# Patient Record
Sex: Female | Born: 1985 | Race: White | Hispanic: No | Marital: Married | Smoking: Never smoker
Health system: Southern US, Community
[De-identification: ages and names within clinical notes are randomized; demographics above are authoritative.]

## PROBLEM LIST (undated history)

## (undated) DIAGNOSIS — R87629 Unspecified abnormal cytological findings in specimens from vagina: Secondary | ICD-10-CM

## (undated) DIAGNOSIS — Z789 Other specified health status: Secondary | ICD-10-CM

## (undated) DIAGNOSIS — Z9889 Other specified postprocedural states: Secondary | ICD-10-CM

## (undated) HISTORY — DX: Unspecified abnormal cytological findings in specimens from vagina: R87.629

## (undated) HISTORY — PX: KNEE SURGERY: SHX244

## (undated) HISTORY — PX: GYNECOLOGIC CRYOSURGERY: SHX857

## (undated) HISTORY — PX: COLPOSCOPY: SHX161

## (undated) HISTORY — PX: WISDOM TOOTH EXTRACTION: SHX21

---
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
## 2003-10-11 ENCOUNTER — Other Ambulatory Visit: Admission: RE | Admit: 2003-10-11 | Discharge: 2003-10-11 | Payer: Self-pay | Admitting: Obstetrics & Gynecology

## 2004-10-18 ENCOUNTER — Other Ambulatory Visit: Admission: RE | Admit: 2004-10-18 | Discharge: 2004-10-18 | Payer: Self-pay | Admitting: Obstetrics & Gynecology

## 2013-04-14 NOTE — L&D Delivery Note (Signed)
Patient was C/C/+2 and pushed for 20 minutes with epidural.   NSVD female infant, Apgars 8,9, weight P.   The patient had one second degree episiotomy extended to partial (capsule only) third degree, repaired with 2-0 vicryl R. Fundus was firm. EBL was expected. Placenta was delivered intact. Vagina was clear.  Baby was vigorous and doing skin to skin with mother.  Alexandra Blair

## 2013-06-28 ENCOUNTER — Inpatient Hospital Stay (HOSPITAL_COMMUNITY)
Admission: EM | Admit: 2013-06-28 | Discharge: 2013-06-29 | Disposition: A | Discharge status: Home / Self Care | Payer: BC Managed Care – PPO | Attending: Obstetrics and Gynecology | Admitting: Obstetrics and Gynecology

## 2013-07-08 LAB — OB RESULTS CONSOLE RPR: RPR: NONREACTIVE

## 2013-07-08 LAB — OB RESULTS CONSOLE GC/CHLAMYDIA
Chlamydia: NEGATIVE
GC PROBE AMP, GENITAL: NEGATIVE

## 2013-07-08 LAB — OB RESULTS CONSOLE ANTIBODY SCREEN: Antibody Screen: NEGATIVE

## 2013-07-08 LAB — OB RESULTS CONSOLE RUBELLA ANTIBODY, IGM: Rubella: IMMUNE

## 2013-07-08 LAB — OB RESULTS CONSOLE HEPATITIS B SURFACE ANTIGEN: Hepatitis B Surface Ag: NEGATIVE

## 2013-07-08 LAB — OB RESULTS CONSOLE ABO/RH: RH Type: POSITIVE

## 2013-07-08 LAB — OB RESULTS CONSOLE HIV ANTIBODY (ROUTINE TESTING): HIV: NONREACTIVE

## 2013-12-30 LAB — OB RESULTS CONSOLE GBS: GBS: NEGATIVE

## 2014-02-02 ENCOUNTER — Inpatient Hospital Stay (HOSPITAL_COMMUNITY)
Admission: AD | Admit: 2014-02-02 | Discharge: 2014-02-02 | Disposition: A | Discharge status: Home / Self Care | Payer: BC Managed Care – PPO | Source: Ambulatory Visit | Attending: Obstetrics | Admitting: Obstetrics

## 2014-02-02 ENCOUNTER — Encounter (HOSPITAL_COMMUNITY): Payer: Self-pay | Admitting: *Deleted

## 2014-02-02 DIAGNOSIS — O480 Post-term pregnancy: Secondary | ICD-10-CM | POA: Insufficient documentation

## 2014-02-02 DIAGNOSIS — Z3A41 41 weeks gestation of pregnancy: Secondary | ICD-10-CM | POA: Diagnosis not present

## 2014-02-02 NOTE — MAU Note (Signed)
HC- [name]  TALKED WITH PT-   PT AND HUSBAND CHOSE TO GO BACK HOME-     HC- HAS PHONE #  AND L/D CHARGE WILL CALL PT.

## 2014-02-02 NOTE — MAU Note (Signed)
PT  SAYS SHE  WENT  TO DR ON Monday-  AND   SCH PT FOR TONIGHT  FOR AN INDUCTION-   FOR POSTDATES.     FEELS  SOME UC.  VE  ON Monday-   CLOSED.     HAD NST-   ALL GOOD.     DENIES HSV AND   MRSA.  GBS-  NEG.

## 2014-02-03 ENCOUNTER — Encounter (HOSPITAL_COMMUNITY): Payer: BC Managed Care – PPO | Admitting: Anesthesiology

## 2014-02-03 ENCOUNTER — Inpatient Hospital Stay (HOSPITAL_COMMUNITY): Payer: BC Managed Care – PPO | Admitting: Anesthesiology

## 2014-02-03 ENCOUNTER — Inpatient Hospital Stay (HOSPITAL_COMMUNITY)
Admission: AD | Admit: 2014-02-03 | Discharge: 2014-02-05 | DRG: 775 | Disposition: A | Discharge status: Home / Self Care | Payer: BC Managed Care – PPO | Source: Ambulatory Visit | Attending: Obstetrics and Gynecology | Admitting: Obstetrics and Gynecology

## 2014-02-03 ENCOUNTER — Encounter (HOSPITAL_COMMUNITY): Payer: Self-pay | Admitting: Obstetrics

## 2014-02-03 DIAGNOSIS — Z3A41 41 weeks gestation of pregnancy: Secondary | ICD-10-CM | POA: Diagnosis present

## 2014-02-03 DIAGNOSIS — O480 Post-term pregnancy: Secondary | ICD-10-CM | POA: Diagnosis present

## 2014-02-03 HISTORY — DX: Other specified postprocedural states: Z98.890

## 2014-02-03 HISTORY — DX: Other specified health status: Z78.9

## 2014-02-03 LAB — HIV ANTIBODY (ROUTINE TESTING W REFLEX): HIV: NONREACTIVE

## 2014-02-03 LAB — CBC
HEMATOCRIT: 35.2 % — AB (ref 36.0–46.0)
HEMOGLOBIN: 12.3 g/dL (ref 12.0–15.0)
MCH: 31.5 pg (ref 26.0–34.0)
MCHC: 34.9 g/dL (ref 30.0–36.0)
MCV: 90 fL (ref 78.0–100.0)
Platelets: 244 10*3/uL (ref 150–400)
RBC: 3.91 MIL/uL (ref 3.87–5.11)
RDW: 13.2 % (ref 11.5–15.5)
WBC: 11.3 10*3/uL — ABNORMAL HIGH (ref 4.0–10.5)

## 2014-02-03 LAB — TYPE AND SCREEN
ABO/RH(D): B POS
Antibody Screen: NEGATIVE

## 2014-02-03 LAB — ABO/RH: ABO/RH(D): B POS

## 2014-02-03 LAB — RPR

## 2014-02-03 MED ORDER — NALBUPHINE HCL 10 MG/ML IJ SOLN: 5.0000 mg | INTRAMUSCULAR | Status: DC | PRN

## 2014-02-03 MED ORDER — OXYTOCIN BOLUS FROM INFUSION
500.0000 mL | INTRAVENOUS | Status: DC
  Administered 2014-02-03: 500 mL via INTRAVENOUS

## 2014-02-03 MED ORDER — FENTANYL 2.5 MCG/ML BUPIVACAINE 1/10 % EPIDURAL INFUSION (WH - ANES)
INTRAMUSCULAR | Status: DC | PRN
  Administered 2014-02-03: 14 mL/h via EPIDURAL

## 2014-02-03 MED ORDER — EPHEDRINE 5 MG/ML INJ
10.0000 mg | INTRAVENOUS | Status: DC | PRN
  Filled 2014-02-03: qty 2

## 2014-02-03 MED ORDER — SODIUM CHLORIDE 0.9 % IJ SOLN: 3.0000 mL | INTRAMUSCULAR | Status: DC | PRN

## 2014-02-03 MED ORDER — FENTANYL 2.5 MCG/ML BUPIVACAINE 1/10 % EPIDURAL INFUSION (WH - ANES)
14.0000 mL/h | INTRAMUSCULAR | Status: DC | PRN
  Administered 2014-02-03 (×2): 14 mL/h via EPIDURAL
  Filled 2014-02-03 (×2): qty 125

## 2014-02-03 MED ORDER — MISOPROSTOL 25 MCG QUARTER TABLET
25.0000 ug | ORAL_TABLET | ORAL | Status: DC | PRN
  Administered 2014-02-03: 25 ug via VAGINAL
  Filled 2014-02-03: qty 0.25
  Filled 2014-02-03: qty 1

## 2014-02-03 MED ORDER — CITRIC ACID-SODIUM CITRATE 334-500 MG/5ML PO SOLN: 30.0000 mL | ORAL | Status: DC | PRN

## 2014-02-03 MED ORDER — LANOLIN HYDROUS EX OINT: TOPICAL_OINTMENT | CUTANEOUS | Status: DC | PRN

## 2014-02-03 MED ORDER — WITCH HAZEL-GLYCERIN EX PADS: 1.0000 "application " | MEDICATED_PAD | CUTANEOUS | Status: DC | PRN

## 2014-02-03 MED ORDER — ZOLPIDEM TARTRATE 5 MG PO TABS
5.0000 mg | ORAL_TABLET | Freq: Every evening | ORAL | Status: DC | PRN
Start: 2014-02-03 — End: 2014-02-05

## 2014-02-03 MED ORDER — SODIUM CHLORIDE 0.9 % IV SOLN: 250.0000 mL | INTRAVENOUS | Status: DC | PRN

## 2014-02-03 MED ORDER — LACTATED RINGERS IV SOLN
500.0000 mL | Freq: Once | INTRAVENOUS | Status: AC
  Administered 2014-02-03: 500 mL via INTRAVENOUS

## 2014-02-03 MED ORDER — SODIUM CHLORIDE 0.9 % IJ SOLN: 3.0000 mL | Freq: Two times a day (BID) | INTRAMUSCULAR | Status: DC

## 2014-02-03 MED ORDER — ONDANSETRON HCL 4 MG/2ML IJ SOLN: 4.0000 mg | INTRAMUSCULAR | Status: DC | PRN

## 2014-02-03 MED ORDER — SENNOSIDES-DOCUSATE SODIUM 8.6-50 MG PO TABS
2.0000 | ORAL_TABLET | ORAL | Status: DC
  Administered 2014-02-03 – 2014-02-05 (×2): 2 via ORAL
  Filled 2014-02-03 (×2): qty 2

## 2014-02-03 MED ORDER — METHYLERGONOVINE MALEATE 0.2 MG/ML IJ SOLN: 0.2000 mg | INTRAMUSCULAR | Status: DC | PRN

## 2014-02-03 MED ORDER — MEASLES, MUMPS & RUBELLA VAC ~~LOC~~ INJ: 0.5000 mL | INJECTION | Freq: Once | SUBCUTANEOUS | Status: DC

## 2014-02-03 MED ORDER — IBUPROFEN 800 MG PO TABS
800.0000 mg | ORAL_TABLET | Freq: Three times a day (TID) | ORAL | Status: DC
  Administered 2014-02-03 – 2014-02-05 (×4): 800 mg via ORAL
  Filled 2014-02-03 (×4): qty 1

## 2014-02-03 MED ORDER — PHENYLEPHRINE 40 MCG/ML (10ML) SYRINGE FOR IV PUSH (FOR BLOOD PRESSURE SUPPORT)
80.0000 ug | PREFILLED_SYRINGE | INTRAVENOUS | Status: DC | PRN
  Filled 2014-02-03: qty 2

## 2014-02-03 MED ORDER — TERBUTALINE SULFATE 1 MG/ML IJ SOLN: 0.2500 mg | Freq: Once | INTRAMUSCULAR | Status: DC | PRN

## 2014-02-03 MED ORDER — LIDOCAINE HCL (PF) 1 % IJ SOLN
INTRAMUSCULAR | Status: DC | PRN
  Administered 2014-02-03 (×2): 4 mL

## 2014-02-03 MED ORDER — LACTATED RINGERS IV SOLN: 500.0000 mL | INTRAVENOUS | Status: DC | PRN

## 2014-02-03 MED ORDER — PHENYLEPHRINE 40 MCG/ML (10ML) SYRINGE FOR IV PUSH (FOR BLOOD PRESSURE SUPPORT)
80.0000 ug | PREFILLED_SYRINGE | INTRAVENOUS | Status: DC | PRN
  Filled 2014-02-03: qty 2
  Filled 2014-02-03: qty 10

## 2014-02-03 MED ORDER — SIMETHICONE 80 MG PO CHEW: 80.0000 mg | CHEWABLE_TABLET | ORAL | Status: DC | PRN

## 2014-02-03 MED ORDER — OXYTOCIN 40 UNITS IN LACTATED RINGERS INFUSION - SIMPLE MED
62.5000 mL/h | INTRAVENOUS | Status: DC
Start: 2014-02-03 — End: 2014-02-03
  Filled 2014-02-03: qty 1000

## 2014-02-03 MED ORDER — OXYCODONE-ACETAMINOPHEN 5-325 MG PO TABS: 2.0000 | ORAL_TABLET | ORAL | Status: DC | PRN

## 2014-02-03 MED ORDER — MAGNESIUM HYDROXIDE 400 MG/5ML PO SUSP: 30.0000 mL | ORAL | Status: DC | PRN

## 2014-02-03 MED ORDER — DIPHENHYDRAMINE HCL 25 MG PO CAPS: 25.0000 mg | ORAL_CAPSULE | Freq: Four times a day (QID) | ORAL | Status: DC | PRN

## 2014-02-03 MED ORDER — PRENATAL MULTIVITAMIN CH: 1.0000 | ORAL_TABLET | Freq: Every day | ORAL | Status: DC

## 2014-02-03 MED ORDER — ONDANSETRON HCL 4 MG/2ML IJ SOLN: 4.0000 mg | Freq: Four times a day (QID) | INTRAMUSCULAR | Status: DC | PRN

## 2014-02-03 MED ORDER — OXYCODONE-ACETAMINOPHEN 5-325 MG PO TABS
2.0000 | ORAL_TABLET | ORAL | Status: DC | PRN
Start: 2014-02-03 — End: 2014-02-05

## 2014-02-03 MED ORDER — LACTATED RINGERS IV SOLN
INTRAVENOUS | Status: DC
  Administered 2014-02-03 (×2): via INTRAVENOUS

## 2014-02-03 MED ORDER — METHYLERGONOVINE MALEATE 0.2 MG PO TABS: 0.2000 mg | ORAL_TABLET | ORAL | Status: DC | PRN

## 2014-02-03 MED ORDER — LIDOCAINE HCL (PF) 1 % IJ SOLN
30.0000 mL | INTRAMUSCULAR | Status: DC | PRN
  Filled 2014-02-03: qty 30

## 2014-02-03 MED ORDER — ACETAMINOPHEN 325 MG PO TABS: 650.0000 mg | ORAL_TABLET | ORAL | Status: DC | PRN

## 2014-02-03 MED ORDER — BENZOCAINE-MENTHOL 20-0.5 % EX AERO
1.0000 "application " | INHALATION_SPRAY | CUTANEOUS | Status: DC | PRN
  Administered 2014-02-03 – 2014-02-05 (×2): 1 via TOPICAL
  Filled 2014-02-03 (×2): qty 56

## 2014-02-03 MED ORDER — DIPHENHYDRAMINE HCL 50 MG/ML IJ SOLN: 12.5000 mg | INTRAMUSCULAR | Status: DC | PRN

## 2014-02-03 MED ORDER — DIBUCAINE 1 % RE OINT: 1.0000 "application " | TOPICAL_OINTMENT | RECTAL | Status: DC | PRN

## 2014-02-03 MED ORDER — ONDANSETRON HCL 4 MG PO TABS: 4.0000 mg | ORAL_TABLET | ORAL | Status: DC | PRN

## 2014-02-03 MED ORDER — OXYTOCIN 40 UNITS IN LACTATED RINGERS INFUSION - SIMPLE MED
1.0000 m[IU]/min | INTRAVENOUS | Status: DC
Start: 2014-02-03 — End: 2014-02-03
  Administered 2014-02-03: 2 m[IU]/min via INTRAVENOUS

## 2014-02-03 MED ORDER — OXYCODONE-ACETAMINOPHEN 5-325 MG PO TABS: 1.0000 | ORAL_TABLET | ORAL | Status: DC | PRN

## 2014-02-03 MED ORDER — FERROUS SULFATE 325 (65 FE) MG PO TABS
325.0000 mg | ORAL_TABLET | Freq: Two times a day (BID) | ORAL | Status: DC
  Administered 2014-02-04 – 2014-02-05 (×3): 325 mg via ORAL
  Filled 2014-02-03 (×3): qty 1

## 2014-02-03 MED ORDER — OXYCODONE-ACETAMINOPHEN 5-325 MG PO TABS
1.0000 | ORAL_TABLET | ORAL | Status: DC | PRN
Start: 2014-02-03 — End: 2014-02-05

## 2014-02-03 MED ORDER — TETANUS-DIPHTH-ACELL PERTUSSIS 5-2.5-18.5 LF-MCG/0.5 IM SUSP
0.5000 mL | Freq: Once | INTRAMUSCULAR | Status: AC
  Administered 2014-02-05: 0.5 mL via INTRAMUSCULAR

## 2014-02-03 NOTE — H&P (Signed)
28 y.o. G1P0 @ 7859w1d presents for IOL for postdates.  Otherwise has good fetal movement and no bleeding.  Past Medical History  Diagnosis Date  . H/O knee surgery   . Medical history non-contributory    History reviewed. No pertinent past surgical history.  OB History  Gravida Para Term Preterm AB SAB TAB Ectopic Multiple Living  1             # Outcome Date GA Lbr Len/2nd Weight Sex Delivery Anes PTL Lv  1 CUR               History   Social History  . Marital Status: Married    Spouse Name: N/A    Number of Children: N/A  . Years of Education: N/A   Occupational History  . Not on file.   Social History Main Topics  . Smoking status: Never Smoker   . Smokeless tobacco: Never Used  . Alcohol Use: No  . Drug Use: No  . Sexual Activity: Not on file   Other Topics Concern  . Not on file   Social History Narrative  . No narrative on file   Review of patient's allergies indicates no known allergies.    Prenatal Transfer Tool  Maternal Diabetes: No Genetic Screening: Normal Maternal Ultrasounds/Referrals: Normal Fetal Ultrasounds or other Referrals:  None Maternal Substance Abuse:  No Significant Maternal Medications:  None Significant Maternal Lab Results: Lab values include: Group B Strep negative  Other PNC: uncomplicated.    Filed Vitals:   02/03/14 0149  BP: 131/84  Pulse: 102  Temp: 98.3 F (36.8 C)  Resp: 18     General:  NAD Lungs: CTAB Cardiac: RRR Abdomen:  soft, gravid, EFW 8.5# Ex:  no edema SVE:  Closed/50  FHTs:  140s mod var, cat 1  Toco:  quiet  Ultrasound 10/12: EFW 3678g (8lb 2oz) 83%, vtx  A/P   28 y.o. 7859w1d  G1 presents for IOL for postdates Cytotec for cervical ripening Epidural upon maternal request GBS neg  Marlow BaarsLARK, Reyansh Kushnir

## 2014-02-03 NOTE — Anesthesia Procedure Notes (Signed)
Epidural Patient location during procedure: OB Start time: 02/03/2014 11:23 AM  Staffing Anesthesiologist: Dyland Panuco A. Performed by: anesthesiologist   Preanesthetic Checklist Completed: patient identified, site marked, surgical consent, pre-op evaluation, timeout performed, IV checked, risks and benefits discussed and monitors and equipment checked  Epidural Patient position: sitting Prep: site prepped and draped and DuraPrep Patient monitoring: continuous pulse ox and blood pressure Approach: midline Location: L4-L5 Injection technique: LOR air  Needle:  Needle type: Tuohy  Needle gauge: 17 G Needle length: 9 cm and 9 Needle insertion depth: 4 cm Catheter type: closed end flexible Catheter size: 19 Gauge Catheter at skin depth: 10 cm Test dose: negative and Other  Assessment Events: blood not aspirated, injection not painful, no injection resistance, negative IV test and no paresthesia  Additional Notes Patient identified. Risks and benefits discussed including failed block, incomplete  Pain control, post dural puncture headache, nerve damage, paralysis, blood pressure Changes, nausea, vomiting, reactions to medications-both toxic and allergic and post Partum back pain. All questions were answered. Patient expressed understanding and wished to proceed. Sterile technique was used throughout procedure. Epidural site was Dressed with sterile barrier dressing. No paresthesias, signs of intravascular injection Or signs of intrathecal spread were encountered.  Patient was more comfortable after the epidural was dosed. Please see RN's note for documentation of vital signs and FHR which are stable.

## 2014-02-03 NOTE — Anesthesia Preprocedure Evaluation (Signed)
Anesthesia Evaluation  Patient identified by MRN, date of birth, ID band Patient awake    Reviewed: Allergy & Precautions, H&P , Patient's Chart, lab work & pertinent test results  Airway Mallampati: III TM Distance: >3 FB Neck ROM: Full    Dental no notable dental hx. (+) Teeth Intact   Pulmonary neg pulmonary ROS,  breath sounds clear to auscultation  Pulmonary exam normal       Cardiovascular negative cardio ROS  Rhythm:Regular Rate:Normal     Neuro/Psych negative neurological ROS  negative psych ROS   GI/Hepatic negative GI ROS, Neg liver ROS,   Endo/Other  Obesity  Renal/GU negative Renal ROS  negative genitourinary   Musculoskeletal negative musculoskeletal ROS (+)   Abdominal (+) + obese,   Peds  Hematology negative hematology ROS (+)   Anesthesia Other Findings   Reproductive/Obstetrics (+) Pregnancy                           Anesthesia Physical Anesthesia Plan  ASA: II  Anesthesia Plan: Epidural   Post-op Pain Management:    Induction:   Airway Management Planned: Natural Airway  Additional Equipment:   Intra-op Plan:   Post-operative Plan:   Informed Consent: I have reviewed the patients History and Physical, chart, labs and discussed the procedure including the risks, benefits and alternatives for the proposed anesthesia with the patient or authorized representative who has indicated his/her understanding and acceptance.     Plan Discussed with: Anesthesiologist  Anesthesia Plan Comments:         Anesthesia Quick Evaluation

## 2014-02-04 LAB — CBC
HCT: 33.6 % — ABNORMAL LOW (ref 36.0–46.0)
HEMOGLOBIN: 11.5 g/dL — AB (ref 12.0–15.0)
MCH: 30.8 pg (ref 26.0–34.0)
MCHC: 34.2 g/dL (ref 30.0–36.0)
MCV: 90.1 fL (ref 78.0–100.0)
Platelets: 206 10*3/uL (ref 150–400)
RBC: 3.73 MIL/uL — AB (ref 3.87–5.11)
RDW: 13.2 % (ref 11.5–15.5)
WBC: 17.2 10*3/uL — ABNORMAL HIGH (ref 4.0–10.5)

## 2014-02-04 NOTE — Progress Notes (Signed)
Patient is eating, ambulating, voiding.  Pain control is good.  Filed Vitals:   02/03/14 2101 02/03/14 2130 02/03/14 2300 02/04/14 0306  BP:  125/86 133/81 127/86  Pulse: 92 95 93 75  Temp:  98.5 F (36.9 C) 99.3 F (37.4 C) 98.2 F (36.8 C)  TempSrc:   Oral Oral  Resp: 20 20 18 18   Height:      Weight:      SpO2:        Fundus firm Perineum without swelling.  Lab Results  Component Value Date   WBC 17.2* 02/04/2014   HGB 11.5* 02/04/2014   HCT 33.6* 02/04/2014   MCV 90.1 02/04/2014   PLT 206 02/04/2014    --/--/B POS (10/23 0610)/RI  A/P Post partum day 1.  Routine care.  Expect d/c routine.    Marcelyn Ruppe A

## 2014-02-04 NOTE — Lactation Note (Signed)
This note was copied from the chart of Alexandra Ashia Dobis. Lactation Consultation Note  Initial visit done.  Breastfeeding consultation services and support information given to patient.  Mom states baby is been latching and nursing well.  Encouraged to feed with cues and to call with concerns/assist prn  Patient Name: Alexandra Cathrine MusterHeather Blair Alexandra Blair's Date: 02/04/2014 Reason for consult: Initial assessment   Maternal Data    Feeding    LATCH Score/Interventions Latch: Repeated attempts needed to sustain latch, nipple held in mouth throughout feeding, stimulation needed to elicit sucking reflex. Intervention(s): Assist with latch;Breast massage;Breast compression  Audible Swallowing: A few with stimulation Intervention(s): Hand expression  Type of Nipple: Everted at rest and after stimulation  Comfort (Breast/Nipple): Soft / non-tender  Problem noted: Mild/Moderate discomfort Interventions (Mild/moderate discomfort): Hand expression  Hold (Positioning): No assistance needed to correctly position infant at breast.  LATCH Score: 8  Lactation Tools Discussed/Used     Consult Status Consult Status: Follow-up Date: 02/05/14 Follow-up type: In-patient    [name], Marqueta Pulley S 02/04/2014, 3:40 PM

## 2014-02-04 NOTE — Anesthesia Postprocedure Evaluation (Signed)
Anesthesia Post Note  Patient: Alexandra BasemanHeather M Blair  Procedure(s) Performed: * No procedures listed *  Anesthesia type: Epidural  Patient location: Mother/Baby  Post pain: Pain level controlled  Post assessment: Post-op Vital signs reviewed  Last Vitals:  Filed Vitals:   02/04/14 0306  BP: 127/86  Pulse: 75  Temp: 36.8 C  Resp: 18    Post vital signs: Reviewed  Level of consciousness:alert  Complications: No apparent anesthesia complications

## 2014-02-05 NOTE — Lactation Note (Signed)
This note was copied from the chart of Alexandra Blair. Lactation Consultation Note  Follow up visit made prior to discharge.  Mom states baby cluster fed all night.  Nipples a little tender.  Mom given comfort gels with instructions.  Basic teaching reviewed along with discharge teaching including engorgement treatment.  Encouraged lactation outpatient services including support group.  Patient Name: Alexandra Blair: 02/05/2014     Maternal Data    Feeding Length of feed: 12 min  LATCH Score/Interventions                      Lactation Tools Discussed/Used     Consult Status      [name], Marjarie Irion S 02/05/2014, 11:03 AM

## 2014-02-05 NOTE — Discharge Summary (Signed)
Obstetric Discharge Summary Reason for Admission: induction of labor Prenatal Procedures: NST and ultrasound Intrapartum Procedures: spontaneous vaginal delivery and episiotomy partial 3rd Postpartum Procedures: none Complications-Operative and Postpartum: none Hemoglobin  Date Value Ref Range Status  02/04/2014 11.5* 12.0 - 15.0 g/dL Final     HCT  Date Value Ref Range Status  02/04/2014 33.6* 36.0 - 46.0 % Final    Physical Exam:  General: alert Lochia: appropriate Uterine Fundus: firm  Discharge Diagnoses: Post-date pregnancy  Discharge Information: Date: 02/05/2014 Activity: pelvic rest Diet: routine Medications: PNV and Ibuprofen Condition: stable Instructions: refer to practice specific booklet Discharge to: home Follow-up Information   Follow up with HORVATH,MICHELLE A, MD. Schedule an appointment as soon as possible for a visit in 4 weeks.   Specialty:  Obstetrics and Gynecology   Contact information:   [address] [place] [address] [phone]       Newborn Data: Live born female  Birth Weight: 7 lb 11.4 oz (3498 g) APGAR: 8, 9  Home with mother.  ANDERSON,MARK E 02/05/2014, 10:10 AM

## 2014-02-05 NOTE — Lactation Note (Signed)
This note was copied from the chart of Alexandra Cambelle Lampley. Lactation Consultation Note  Baby cluster fed last night.  Some nipple tenderness.  Reviewed basics and discharge teaching including engorgement treatment.  Comfort gels given with instructions.  Encouraged outpatient lactation services and support group.  Patient Name: Alexandra Cathrine MusterHeather Leaton [id number]'s Date: 02/05/2014     Maternal Data    Feeding Length of feed: 12 min  LATCH Score/Interventions                      Lactation Tools Discussed/Used     Consult Status      [name], Ayn Domangue S 02/05/2014, 10:25 AM

## 2014-02-05 NOTE — Progress Notes (Signed)
PPD#2 Pt without complaints. Wants to go home. VSSAF IMP/ stable Plan/ Will discharge.

## 2014-02-13 ENCOUNTER — Encounter (HOSPITAL_COMMUNITY): Payer: Self-pay | Admitting: Obstetrics

## 2017-02-23 ENCOUNTER — Inpatient Hospital Stay (HOSPITAL_COMMUNITY)
Admission: AD | Admit: 2017-02-23 | Discharge: 2017-02-23 | Disposition: A | Discharge status: Home / Self Care | Payer: BLUE CROSS/BLUE SHIELD | Source: Ambulatory Visit | Attending: Obstetrics & Gynecology | Admitting: Obstetrics & Gynecology

## 2017-02-23 DIAGNOSIS — O200 Threatened abortion: Secondary | ICD-10-CM | POA: Insufficient documentation

## 2017-02-23 DIAGNOSIS — O4690 Antepartum hemorrhage, unspecified, unspecified trimester: Secondary | ICD-10-CM

## 2017-02-23 LAB — URINALYSIS, COMPLETE (UACMP) WITH MICROSCOPIC
Bilirubin Urine: NEGATIVE
GLUCOSE, UA: NEGATIVE mg/dL
KETONES UR: NEGATIVE mg/dL
Nitrite: NEGATIVE
Protein, ur: NEGATIVE mg/dL
Specific Gravity, Urine: 1.005 (ref 1.005–1.030)
pH: 5 (ref 5.0–8.0)

## 2017-02-23 LAB — HCG, QUANTITATIVE, PREGNANCY: HCG, BETA CHAIN, QUANT, S: 21 m[IU]/mL — AB (ref <5)

## 2017-02-23 LAB — POCT PREGNANCY, URINE: PREG TEST UR: NEGATIVE

## 2017-02-23 NOTE — MAU Provider Note (Signed)
History     CSN: [id number]  Arrival date and time: 02/23/17 1448   None     No chief complaint on file.  HPI   Alexandra BasemanHeather M Blair is a 31 y.o. female G1P1001 @ Unknown here in MAU with concerns about a miscarriage. States her LMP was 01/15/2017. States she has been spotting off and on for the last week. States she passed a small clot this morning that she is sure was the pregnancy. [facility]  States she had a positive test at home this month. She continues to have light spotting, no pain currently.   OB History    Gravida Para Term Preterm AB Living   1 1 1     1    SAB TAB Ectopic Multiple Live Births           1      Past Medical History:  Diagnosis Date  . H/O knee surgery   . Medical history non-contributory     Past Surgical History:  Procedure Laterality Date  . KNEE SURGERY N/A   . WISDOM TOOTH EXTRACTION N/A     No family history on file.  Social History   Tobacco Use  . Smoking status: Never Smoker  . Smokeless tobacco: Never Used  Substance Use Topics  . Alcohol use: No  . Drug use: No    Allergies:  Allergies  Allergen Reactions  . Codeine Other (See Comments)    dizzy    Medications Prior to Admission  Medication Sig Dispense Refill Last Dose  . Prenatal Vit-Fe Fumarate-FA (PRENATAL MULTIVITAMIN) TABS tablet Take 1 tablet by mouth daily at 12 noon.   02/02/2014 at Unknown time   Results for orders placed or performed during the hospital encounter of 02/23/17 (from the past 48 hour(s))  Urinalysis, Complete w Microscopic     Status: Abnormal   Collection Time: 02/23/17  1:30 PM  Result Value Ref Range   Color, Urine YELLOW YELLOW   APPearance CLOUDY (A) CLEAR   Specific Gravity, Urine 1.005 1.005 - 1.030   pH 5.0 5.0 - 8.0   Glucose, UA NEGATIVE NEGATIVE mg/dL   Hgb urine dipstick LARGE (A) NEGATIVE   Bilirubin Urine NEGATIVE NEGATIVE   Ketones, ur NEGATIVE NEGATIVE mg/dL    Protein, ur NEGATIVE NEGATIVE mg/dL   Nitrite NEGATIVE NEGATIVE   Leukocytes, UA TRACE (A) NEGATIVE   RBC / HPF 0-5 0 - 5 RBC/hpf   WBC, UA 6-30 0 - 5 WBC/hpf   Bacteria, UA FEW (A) NONE SEEN   Squamous Epithelial / LPF 6-30 (A) NONE SEEN   Mucus PRESENT   Pregnancy, urine POC     Status: None   Collection Time: 02/23/17  4:02 PM  Result Value Ref Range   Preg Test, Ur NEGATIVE NEGATIVE    Comment:        THE SENSITIVITY OF THIS METHODOLOGY IS >24 mIU/mL   hCG, quantitative, pregnancy     Status: Abnormal   Collection Time: 02/23/17  4:33 PM  Result Value Ref Range   hCG, Beta Chain, Quant, S 21 (H) <5 mIU/mL    Comment:          GEST. AGE      CONC.  (mIU/mL)   <=1 WEEK        5 - 50     2 WEEKS       50 - 500     3  WEEKS       100 - 10,000     4 WEEKS     1,000 - 30,000     5 WEEKS     3,500 - 115,000   6-8 WEEKS     12,000 - 270,000    12 WEEKS     15,000 - 220,000        FEMALE AND NON-PREGNANT FEMALE:     LESS THAN 5 mIU/mL     Review of Systems  Constitutional: Negative for fever.  Gastrointestinal: Negative for abdominal pain.  Genitourinary: Positive for vaginal pain. Negative for vaginal discharge.   Physical Exam   Blood pressure 131/78, pulse 93, temperature 98.8 F (37.1 C), temperature source Oral, resp. rate 17, weight 64 kg (141 lb), SpO2 100 %, unknown if currently breastfeeding.  Physical Exam  Constitutional: She is oriented to person, place, and time. She appears well-developed and well-nourished. No distress.  HENT:  Head: Normocephalic.  Eyes: Pupils are equal, round, and reactive to light.  Musculoskeletal: Normal range of motion.  Neurological: She is alert and oriented to person, place, and time.  Skin: Skin is warm. She is not diaphoretic.  Psychiatric: Her behavior is normal.   MAU Course  Procedures  None  MDM  Patient had been waiting for a long period of time in the waiting area. Urine pregnancy test negative. Quant ordered.  Patient is without pain today. Discussed with Dr. Mora ApplPinn. Ok for patient to leave while Hcg levels are pending. I will call the patient with the results. Patient is agreeable.   Assessment and Plan   A:  1. Threatened miscarriage   2. Vaginal bleeding in pregnancy     P:  Discharge home with strict return precautions Patient to follow up with Dr. Madalyn RobPinn's office in the next week. Return to MAU if symptoms worsen Bleeding precautions Quant 21 today,  Left message to have patient return my call to discuss.   Venia Carbonasch, Jennifer I, NP 02/23/2017 6:05 PM

## 2017-04-14 NOTE — L&D Delivery Note (Signed)
Delivery Note At 10:10 AM a viable and healthy female was delivered via  (Presentation: Right Occiput; Anterior ).  APGAR: 9, 9; weight  pending.   Placenta status: spontaneous, intact.  Cord: 3V  Anesthesia:  Epidural Episiotomy:  none Lacerations:  1st degree vaginal Suture Repair: 3.0 vicryl Est. Blood Loss (mL):  125 cc  Mom to postpartum.  Baby to Couplet care / Skin to Skin.  Waynard Reeds 01/19/2018, 10:25 AM

## 2017-06-19 LAB — OB RESULTS CONSOLE ABO/RH: RH TYPE: POSITIVE

## 2017-06-19 LAB — OB RESULTS CONSOLE RUBELLA ANTIBODY, IGM: Rubella: IMMUNE

## 2017-06-19 LAB — OB RESULTS CONSOLE GC/CHLAMYDIA
Chlamydia: NEGATIVE
GC PROBE AMP, GENITAL: NEGATIVE

## 2017-06-19 LAB — OB RESULTS CONSOLE RPR: RPR: NONREACTIVE

## 2017-06-19 LAB — OB RESULTS CONSOLE HEPATITIS B SURFACE ANTIGEN: HEP B S AG: NEGATIVE

## 2017-06-19 LAB — OB RESULTS CONSOLE ANTIBODY SCREEN: Antibody Screen: NEGATIVE

## 2017-06-19 LAB — OB RESULTS CONSOLE HIV ANTIBODY (ROUTINE TESTING): HIV: NONREACTIVE

## 2017-12-25 LAB — OB RESULTS CONSOLE GBS: GBS: NEGATIVE

## 2018-01-12 ENCOUNTER — Telehealth (HOSPITAL_COMMUNITY): Payer: Self-pay | Admitting: *Deleted

## 2018-01-12 ENCOUNTER — Other Ambulatory Visit: Payer: Self-pay | Admitting: Obstetrics and Gynecology

## 2018-01-12 ENCOUNTER — Encounter (HOSPITAL_COMMUNITY): Payer: Self-pay | Admitting: *Deleted

## 2018-01-12 NOTE — Telephone Encounter (Signed)
Preadmission screen  

## 2018-01-19 ENCOUNTER — Inpatient Hospital Stay (HOSPITAL_COMMUNITY): Payer: BLUE CROSS/BLUE SHIELD | Admitting: Anesthesiology

## 2018-01-19 ENCOUNTER — Encounter (HOSPITAL_COMMUNITY): Payer: Self-pay

## 2018-01-19 ENCOUNTER — Inpatient Hospital Stay (HOSPITAL_COMMUNITY)
Admission: AD | Admit: 2018-01-19 | Discharge: 2018-01-20 | DRG: 807 | Disposition: A | Discharge status: Home / Self Care | Payer: BLUE CROSS/BLUE SHIELD | Attending: Obstetrics and Gynecology | Admitting: Obstetrics and Gynecology

## 2018-01-19 ENCOUNTER — Other Ambulatory Visit: Payer: Self-pay

## 2018-01-19 ENCOUNTER — Inpatient Hospital Stay (HOSPITAL_COMMUNITY)
Admission: RE | Admit: 2018-01-19 | Payer: Self-pay | Source: Ambulatory Visit | Attending: Obstetrics | Admitting: Obstetrics

## 2018-01-19 DIAGNOSIS — Z23 Encounter for immunization: Secondary | ICD-10-CM | POA: Diagnosis not present

## 2018-01-19 DIAGNOSIS — O99214 Obesity complicating childbirth: Secondary | ICD-10-CM | POA: Diagnosis present

## 2018-01-19 DIAGNOSIS — Z3A39 39 weeks gestation of pregnancy: Secondary | ICD-10-CM | POA: Diagnosis not present

## 2018-01-19 DIAGNOSIS — Z3483 Encounter for supervision of other normal pregnancy, third trimester: Secondary | ICD-10-CM | POA: Diagnosis present

## 2018-01-19 DIAGNOSIS — Z3493 Encounter for supervision of normal pregnancy, unspecified, third trimester: Secondary | ICD-10-CM

## 2018-01-19 DIAGNOSIS — E669 Obesity, unspecified: Secondary | ICD-10-CM | POA: Diagnosis present

## 2018-01-19 LAB — CBC
HEMATOCRIT: 34.2 % — AB (ref 36.0–46.0)
HEMOGLOBIN: 11.7 g/dL — AB (ref 12.0–15.0)
MCH: 30.6 pg (ref 26.0–34.0)
MCHC: 34.2 g/dL (ref 30.0–36.0)
MCV: 89.5 fL (ref 80.0–100.0)
Platelets: 257 10*3/uL (ref 150–400)
RBC: 3.82 MIL/uL — AB (ref 3.87–5.11)
RDW: 13.3 % (ref 11.5–15.5)
WBC: 11 10*3/uL — AB (ref 4.0–10.5)

## 2018-01-19 LAB — TYPE AND SCREEN
ABO/RH(D): B POS
Antibody Screen: NEGATIVE

## 2018-01-19 LAB — RPR: RPR: NONREACTIVE

## 2018-01-19 LAB — POCT FERN TEST: POCT Fern Test: POSITIVE

## 2018-01-19 MED ORDER — LACTATED RINGERS IV SOLN
INTRAVENOUS | Status: DC
  Administered 2018-01-19: 04:00:00 via INTRAVENOUS

## 2018-01-19 MED ORDER — LACTATED RINGERS IV SOLN: 500.0000 mL | INTRAVENOUS | Status: DC | PRN

## 2018-01-19 MED ORDER — SENNOSIDES-DOCUSATE SODIUM 8.6-50 MG PO TABS
2.0000 | ORAL_TABLET | ORAL | Status: DC
  Administered 2018-01-19: 2 via ORAL
  Filled 2018-01-19: qty 2

## 2018-01-19 MED ORDER — ONDANSETRON HCL 4 MG/2ML IJ SOLN: 4.0000 mg | INTRAMUSCULAR | Status: DC | PRN

## 2018-01-19 MED ORDER — EPHEDRINE 5 MG/ML INJ: 10.0000 mg | INTRAVENOUS | Status: DC | PRN

## 2018-01-19 MED ORDER — ACETAMINOPHEN 325 MG PO TABS: 650.0000 mg | ORAL_TABLET | ORAL | Status: DC | PRN

## 2018-01-19 MED ORDER — ACETAMINOPHEN 325 MG PO TABS
650.0000 mg | ORAL_TABLET | ORAL | Status: DC | PRN
  Administered 2018-01-19: 650 mg via ORAL
  Filled 2018-01-19: qty 2

## 2018-01-19 MED ORDER — OXYTOCIN BOLUS FROM INFUSION: 500.0000 mL | Freq: Once | INTRAVENOUS | Status: DC

## 2018-01-19 MED ORDER — ONDANSETRON HCL 4 MG PO TABS: 4.0000 mg | ORAL_TABLET | ORAL | Status: DC | PRN

## 2018-01-19 MED ORDER — OXYTOCIN 40 UNITS IN LACTATED RINGERS INFUSION - SIMPLE MED
1.0000 m[IU]/min | INTRAVENOUS | Status: DC
  Administered 2018-01-19: 2 m[IU]/min via INTRAVENOUS

## 2018-01-19 MED ORDER — PHENYLEPHRINE 40 MCG/ML (10ML) SYRINGE FOR IV PUSH (FOR BLOOD PRESSURE SUPPORT): 80.0000 ug | PREFILLED_SYRINGE | INTRAVENOUS | Status: DC | PRN

## 2018-01-19 MED ORDER — TERBUTALINE SULFATE 1 MG/ML IJ SOLN: 0.2500 mg | Freq: Once | INTRAMUSCULAR | Status: DC | PRN

## 2018-01-19 MED ORDER — FENTANYL CITRATE (PF) 100 MCG/2ML IJ SOLN: 50.0000 ug | INTRAMUSCULAR | Status: DC | PRN

## 2018-01-19 MED ORDER — METHYLERGONOVINE MALEATE 0.2 MG/ML IJ SOLN
0.2000 mg | INTRAMUSCULAR | Status: DC | PRN
  Administered 2018-01-19: 0.2 mg via INTRAMUSCULAR

## 2018-01-19 MED ORDER — ONDANSETRON HCL 4 MG/2ML IJ SOLN: 4.0000 mg | Freq: Four times a day (QID) | INTRAMUSCULAR | Status: DC | PRN

## 2018-01-19 MED ORDER — DIBUCAINE 1 % RE OINT
1.0000 "application " | TOPICAL_OINTMENT | RECTAL | Status: DC | PRN
  Filled 2018-01-19: qty 28

## 2018-01-19 MED ORDER — METHYLERGONOVINE MALEATE 0.2 MG/ML IJ SOLN
INTRAMUSCULAR | Status: AC
  Filled 2018-01-19: qty 1

## 2018-01-19 MED ORDER — FENTANYL 2.5 MCG/ML BUPIVACAINE 1/10 % EPIDURAL INFUSION (WH - ANES)
14.0000 mL/h | INTRAMUSCULAR | Status: DC | PRN
  Administered 2018-01-19: 14 mL/h via EPIDURAL

## 2018-01-19 MED ORDER — WITCH HAZEL-GLYCERIN EX PADS: 1.0000 "application " | MEDICATED_PAD | CUTANEOUS | Status: DC | PRN

## 2018-01-19 MED ORDER — LIDOCAINE HCL (PF) 1 % IJ SOLN
INTRAMUSCULAR | Status: DC | PRN
  Administered 2018-01-19: 13 mL via EPIDURAL

## 2018-01-19 MED ORDER — LIDOCAINE HCL (PF) 1 % IJ SOLN: 30.0000 mL | INTRAMUSCULAR | Status: DC | PRN

## 2018-01-19 MED ORDER — SIMETHICONE 80 MG PO CHEW: 80.0000 mg | CHEWABLE_TABLET | ORAL | Status: DC | PRN

## 2018-01-19 MED ORDER — SOD CITRATE-CITRIC ACID 500-334 MG/5ML PO SOLN: 30.0000 mL | ORAL | Status: DC | PRN

## 2018-01-19 MED ORDER — OXYTOCIN 40 UNITS IN LACTATED RINGERS INFUSION - SIMPLE MED
2.5000 [IU]/h | INTRAVENOUS | Status: DC
  Filled 2018-01-19: qty 1000

## 2018-01-19 MED ORDER — PRENATAL MULTIVITAMIN CH
1.0000 | ORAL_TABLET | Freq: Every day | ORAL | Status: DC
  Administered 2018-01-20: 1 via ORAL
  Filled 2018-01-19: qty 1

## 2018-01-19 MED ORDER — DIPHENHYDRAMINE HCL 50 MG/ML IJ SOLN: 12.5000 mg | INTRAMUSCULAR | Status: DC | PRN

## 2018-01-19 MED ORDER — OXYCODONE-ACETAMINOPHEN 5-325 MG PO TABS: 2.0000 | ORAL_TABLET | ORAL | Status: DC | PRN

## 2018-01-19 MED ORDER — TETANUS-DIPHTH-ACELL PERTUSSIS 5-2.5-18.5 LF-MCG/0.5 IM SUSP: 0.5000 mL | Freq: Once | INTRAMUSCULAR | Status: DC

## 2018-01-19 MED ORDER — OXYCODONE-ACETAMINOPHEN 5-325 MG PO TABS: 1.0000 | ORAL_TABLET | ORAL | Status: DC | PRN

## 2018-01-19 MED ORDER — FLEET ENEMA 7-19 GM/118ML RE ENEM: 1.0000 | ENEMA | RECTAL | Status: DC | PRN

## 2018-01-19 MED ORDER — IBUPROFEN 600 MG PO TABS
600.0000 mg | ORAL_TABLET | Freq: Four times a day (QID) | ORAL | Status: DC
  Administered 2018-01-19 – 2018-01-20 (×4): 600 mg via ORAL
  Filled 2018-01-19 (×4): qty 1

## 2018-01-19 MED ORDER — MISOPROSTOL 25 MCG QUARTER TABLET
25.0000 ug | ORAL_TABLET | ORAL | Status: DC | PRN
  Filled 2018-01-19: qty 1

## 2018-01-19 MED ORDER — LACTATED RINGERS IV SOLN: INTRAVENOUS | Status: DC

## 2018-01-19 MED ORDER — FENTANYL 2.5 MCG/ML BUPIVACAINE 1/10 % EPIDURAL INFUSION (WH - ANES): 14.0000 mL/h | INTRAMUSCULAR | Status: DC | PRN

## 2018-01-19 MED ORDER — COCONUT OIL OIL
1.0000 "application " | TOPICAL_OIL | Status: DC | PRN
  Filled 2018-01-19: qty 120

## 2018-01-19 MED ORDER — OXYTOCIN 40 UNITS IN LACTATED RINGERS INFUSION - SIMPLE MED: 2.5000 [IU]/h | INTRAVENOUS | Status: DC

## 2018-01-19 MED ORDER — LACTATED RINGERS IV SOLN
500.0000 mL | Freq: Once | INTRAVENOUS | Status: AC
  Administered 2018-01-19: 500 mL via INTRAVENOUS

## 2018-01-19 MED ORDER — FENTANYL 2.5 MCG/ML BUPIVACAINE 1/10 % EPIDURAL INFUSION (WH - ANES)
INTRAMUSCULAR | Status: AC
  Filled 2018-01-19: qty 100

## 2018-01-19 MED ORDER — PHENYLEPHRINE 40 MCG/ML (10ML) SYRINGE FOR IV PUSH (FOR BLOOD PRESSURE SUPPORT)
PREFILLED_SYRINGE | INTRAVENOUS | Status: AC
  Filled 2018-01-19: qty 10

## 2018-01-19 MED ORDER — DIPHENHYDRAMINE HCL 25 MG PO CAPS: 25.0000 mg | ORAL_CAPSULE | Freq: Four times a day (QID) | ORAL | Status: DC | PRN

## 2018-01-19 MED ORDER — BENZOCAINE-MENTHOL 20-0.5 % EX AERO
1.0000 "application " | INHALATION_SPRAY | CUTANEOUS | Status: DC | PRN
  Filled 2018-01-19: qty 56

## 2018-01-19 MED ORDER — METHYLERGONOVINE MALEATE 0.2 MG PO TABS: 0.2000 mg | ORAL_TABLET | ORAL | Status: DC | PRN

## 2018-01-19 MED ORDER — LIDOCAINE HCL (PF) 1 % IJ SOLN
30.0000 mL | INTRAMUSCULAR | Status: DC | PRN
  Filled 2018-01-19: qty 30

## 2018-01-19 MED ORDER — LACTATED RINGERS IV SOLN: 500.0000 mL | Freq: Once | INTRAVENOUS | Status: DC

## 2018-01-19 NOTE — Progress Notes (Signed)
This note also relates to the following rows which could not be included: BP - Cannot attach notes to unvalidated device data Pulse Rate - Cannot attach notes to unvalidated device data  Notified DR Tenny Craw of bleeding, 0.2mg  terbutaline IM R Quad

## 2018-01-19 NOTE — Lactation Note (Signed)
This note was copied from a baby's chart. Lactation Consultation Note  Patient Name: Alexandra Blair [id number] Date: 01/19/2018 Reason for consult: Initial assessment;Term  4 hours old FT female who is being exclusively BF by his mother, she's a P2 and experienced BF. 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 Mom already knows how to hand express and she's been able to see drops of colostrum when doing so. She has a Medela DEBP at home.  Baby was asleep swaddled in visitor's arms when entering the room, offered assistance with latch but mom politely declined stating baby already fed. Same with hand expression, mom wished her visitors to stay; LC respected her wishes. Per mom feedings at the breast are comfortable and she's able to hear baby swallowing when at the breast. Asked mom to call for assistance when needed.  Mom's main concern right now is going back to work and not being able to have a good supply and having to bottle fed formula again. Discussed with mom how to boost her milk supply before going back to work, shared some tips for "power pumping" and how to pump at work. Reviewed cluster feeding, milk storage guidelines and pumping bras. Mom was very appreciative and engaged in [facility] consultation  Feeding plan:  1. Encouraged mom to feed baby STS 8-12 times/24 hours or sooner if feeding cues are present. 2. Hand expression and spoon feeding was also encouraged  BF brochure, BF resources and feeding diary were reviewed. Mom reported all questions were answered, she's aware of LC services and will call PRN.  Maternal Data Formula Feeding for Exclusion: No Has patient been taught Hand Expression?: Yes Does the patient have breastfeeding experience prior to this delivery?: Yes  Feeding Feeding Type: Breast  Fed  Interventions Interventions: Breast feeding basics reviewed  Lactation Tools Discussed/Used WIC Program: No   Consult Status Consult Status: Follow-up Date: 01/20/18 Follow-up type: In-patient    Alexandra Blair Alexandra Blair 01/19/2018, 2:46 PM

## 2018-01-19 NOTE — Anesthesia Procedure Notes (Signed)
Epidural Patient location during procedure: OB Start time: 01/19/2018 4:09 AM End time: 01/19/2018 4:19 AM  Staffing Anesthesiologist: Lowella Curb, MD Performed: anesthesiologist   Preanesthetic Checklist Completed: patient identified, site marked, surgical consent, pre-op evaluation, timeout performed, IV checked, risks and benefits discussed and monitors and equipment checked  Epidural Patient position: sitting Prep: ChloraPrep Patient monitoring: heart rate, cardiac monitor, continuous pulse ox and blood pressure Approach: midline Location: L2-L3 Injection technique: LOR saline  Needle:  Needle type: Tuohy  Needle gauge: 17 G Needle length: 9 cm Needle insertion depth: 5 cm Catheter type: closed end flexible Catheter size: 20 Guage Catheter at skin depth: 9 cm Test dose: negative  Assessment Events: blood not aspirated, injection not painful, no injection resistance, negative IV test and no paresthesia  Additional Notes Reason for block:procedure for pain

## 2018-01-19 NOTE — MAU Note (Signed)
Pt states water broke at 11:30pm. States she had some cramping and went to the bathroom and her water broke. Continued leaking. Pt reports some irregular contractions. Reports pink-tinged fluid. Reports good fetal movement. States cervix was closed Wednesday.

## 2018-01-19 NOTE — Anesthesia Preprocedure Evaluation (Signed)
Anesthesia Evaluation  Patient identified by MRN, date of birth, ID band Patient awake    Reviewed: Allergy & Precautions, H&P , Patient's Chart, lab work & pertinent test results  Airway Mallampati: III  TM Distance: >3 FB Neck ROM: Full    Dental no notable dental hx. (+) Teeth Intact   Pulmonary neg pulmonary ROS,    Pulmonary exam normal breath sounds clear to auscultation       Cardiovascular negative cardio ROS Normal cardiovascular exam Rhythm:Regular Rate:Normal     Neuro/Psych negative neurological ROS  negative psych ROS   GI/Hepatic negative GI ROS, Neg liver ROS,   Endo/Other  Obesity   Renal/GU negative Renal ROS  negative genitourinary   Musculoskeletal negative musculoskeletal ROS (+)   Abdominal (+) + obese,   Peds  Hematology negative hematology ROS (+)   Anesthesia Other Findings   Reproductive/Obstetrics (+) Pregnancy                             Anesthesia Physical  Anesthesia Plan  ASA: II  Anesthesia Plan: Epidural   Post-op Pain Management:    Induction:   PONV Risk Score and Plan:   Airway Management Planned: Natural Airway  Additional Equipment:   Intra-op Plan:   Post-operative Plan:   Informed Consent: I have reviewed the patients History and Physical, chart, labs and discussed the procedure including the risks, benefits and alternatives for the proposed anesthesia with the patient or authorized representative who has indicated his/her understanding and acceptance.       Plan Discussed with: Anesthesiologist  Anesthesia Plan Comments:         Anesthesia Quick Evaluation  

## 2018-01-19 NOTE — Anesthesia Postprocedure Evaluation (Signed)
Anesthesia Post Note  Patient: Alexandra Blair  Procedure(s) Performed: AN AD HOC LABOR EPIDURAL     Patient location during evaluation: Mother Baby Anesthesia Type: Epidural Level of consciousness: awake and alert Pain management: pain level controlled Vital Signs Assessment: post-procedure vital signs reviewed and stable Respiratory status: spontaneous breathing, nonlabored ventilation and respiratory function stable Cardiovascular status: stable Postop Assessment: no headache, no backache and epidural receding Anesthetic complications: no    Last Vitals:  Vitals:   01/19/18 1215 01/19/18 1300  BP: 119/74 126/81  Pulse: 85 79  Resp:  16  Temp:  37.3 C  SpO2:      Last Pain:  Vitals:   01/19/18 1300  TempSrc:   PainSc: 0-No pain   Pain Goal:                 [name]

## 2018-01-19 NOTE — Anesthesia Pain Management Evaluation Note (Signed)
  CRNA Pain Management Visit Note  Patient: Alexandra Blair, 32 y.o., female  "Hello I am a member of the anesthesia team at [facility]. We have an anesthesia team available at all times to provide care throughout the hospital, including epidural management and anesthesia for C-section. I don't know your plan for the delivery whether it a natural birth, water birth, IV sedation, nitrous supplementation, doula or epidural, but we want to meet your pain goals."   1.Was your pain managed to your expectations on prior hospitalizations?   Yes   2.What is your expectation for pain management during this hospitalization?     Epidural  3.How can we help you reach that goal? Epidural in place  Record the patient's initial score and the patient's pain goal.   Pain: 0  Pain Goal: 5 [facility] wants you to be able to say your pain was always managed very well.  Kennard Fildes 01/19/2018

## 2018-01-19 NOTE — H&P (Signed)
Alexandra Blair is a 32 y.o. female presenting for leaking fluid  32 yo G2P1001 @ 39+5 presents for leaking fluid and was confirmed SROM. Her pregnancy has been uncomplicated to this point. OB History    Gravida  2   Para  1   Term  1   Preterm      AB      Living  1     SAB      TAB      Ectopic      Multiple      Live Births  1          Past Medical History:  Diagnosis Date  . H/O knee surgery   . Medical history non-contributory    Past Surgical History:  Procedure Laterality Date  . GYNECOLOGIC CRYOSURGERY    . KNEE SURGERY N/A   . WISDOM TOOTH EXTRACTION N/A    Family History: family history is not on file. Social History:  reports that she has never smoked. She has never used smokeless tobacco. She reports that she does not drink alcohol or use drugs.     Maternal Diabetes: No Genetic Screening: Normal Maternal Ultrasounds/Referrals: Normal Fetal Ultrasounds or other Referrals:  None Maternal Substance Abuse:  No Significant Maternal Medications:  None Significant Maternal Lab Results:  None Other Comments:  None  ROS History Dilation: 9 Effacement (%): 90 Station: -1, 0 Exam by:: D herr rn Blood pressure 110/69, pulse 82, temperature 98.5 F (36.9 C), temperature source Oral, resp. rate (!) 49, height 5\' 3"  (1.6 m), weight 76.2 kg, last menstrual period 04/16/2017, SpO2 99 %, unknown if currently breastfeeding. Exam Physical Exam  Prenatal labs: ABO, Rh: --/--/B POS (10/08 0116) Antibody: NEG (10/08 0116) Rubella: Immune (03/08 0000) RPR: Nonreactive (03/08 0000)  HBsAg: Negative (03/08 0000)  HIV: Non-reactive (03/08 0000)  GBS: Negative (09/13 0000)   Assessment/Plan: 1) Admit 2) Epidural     Waynard Reeds 01/19/2018, 8:45 AM

## 2018-01-20 ENCOUNTER — Inpatient Hospital Stay (HOSPITAL_COMMUNITY): Admission: RE | Admit: 2018-01-20 | Payer: BLUE CROSS/BLUE SHIELD | Source: Ambulatory Visit

## 2018-01-20 ENCOUNTER — Inpatient Hospital Stay (HOSPITAL_COMMUNITY)
Admission: RE | Admit: 2018-01-20 | Payer: BLUE CROSS/BLUE SHIELD | Source: Ambulatory Visit | Attending: Obstetrics | Admitting: Obstetrics

## 2018-01-20 LAB — CBC
HCT: 34.5 % — ABNORMAL LOW (ref 36.0–46.0)
HEMOGLOBIN: 11.8 g/dL — AB (ref 12.0–15.0)
MCH: 30.6 pg (ref 26.0–34.0)
MCHC: 34.2 g/dL (ref 30.0–36.0)
MCV: 89.4 fL (ref 80.0–100.0)
Platelets: 206 10*3/uL (ref 150–400)
RBC: 3.86 MIL/uL — AB (ref 3.87–5.11)
RDW: 13.4 % (ref 11.5–15.5)
WBC: 15.3 10*3/uL — AB (ref 4.0–10.5)
nRBC: 0 % (ref 0.0–0.2)

## 2018-01-20 MED ORDER — INFLUENZA VAC SPLIT QUAD 0.5 ML IM SUSY
0.5000 mL | PREFILLED_SYRINGE | Freq: Once | INTRAMUSCULAR | Status: AC
  Administered 2018-01-20: 0.5 mL via INTRAMUSCULAR

## 2018-01-20 MED ORDER — INFLUENZA VAC SPLIT QUAD 0.5 ML IM SUSY: 0.5000 mL | PREFILLED_SYRINGE | INTRAMUSCULAR | Status: DC

## 2018-01-20 MED ORDER — IBUPROFEN 600 MG PO TABS: 600.0000 mg | ORAL_TABLET | Freq: Four times a day (QID) | ORAL | 0 refills | Status: AC

## 2018-01-20 NOTE — Progress Notes (Signed)
Patient is doing well.  She is ambulating, voiding, tolerating PO.  Pain control is good.  Lochia is appropriate  Vitals:   01/19/18 1400 01/19/18 1739 01/19/18 2137 01/20/18 0423  BP: 130/84 126/83 117/78 121/78  Pulse: 76 77 71 79  Resp: 16 16 18 16   Temp: 98.5 F (36.9 C) 98.5 F (36.9 C) 98.3 F (36.8 C) 98.3 F (36.8 C)  TempSrc: Oral Oral Oral Oral  SpO2:   98% 96%  Weight:      Height:        NAD Fundus firm Ext: no edema  Lab Results  Component Value Date   WBC 15.3 (H) 01/20/2018   HGB 11.8 (L) 01/20/2018   HCT 34.5 (L) 01/20/2018   MCV 89.4 01/20/2018   PLT 206 01/20/2018    --/--/B POS (10/08 0116)/RImmune  A/P 32 y.o. [id number] PPD#1 s/p TSVD. Routine care.   Desires d/c today. Meeting all goals  Desires circumcision. Discussed r/b/a of the procedure. Reviewed that circumcision is an elective surgical procedure and not considered medically necessary. Reviewed the risks of the procedure including the risk of infection, bleeding, damage to surrounding structures, including scrotum, shaft, urethra and head of penis, and an undesired cosmetic effect requiring additional procedures for revision. Consent signed.      [facility] GEFFEL [employer]

## 2018-01-20 NOTE — Discharge Summary (Signed)
Obstetric Discharge Summary Reason for Admission: rupture of membranes Prenatal Procedures: none Intrapartum Procedures: spontaneous vaginal delivery Postpartum Procedures: flu vaccine Complications-Operative and Postpartum: 1 degree perineal laceration Hemoglobin  Date Value Ref Range Status  01/20/2018 11.8 (L) 12.0 - 15.0 g/dL Final   HCT  Date Value Ref Range Status  01/20/2018 34.5 (L) 36.0 - 46.0 % Final    Physical Exam:  General: alert, cooperative and appears stated age 32: appropriate Uterine Fundus: firm DVT Evaluation: No evidence of DVT seen on physical exam.  Discharge Diagnoses: Term Pregnancy-delivered  Discharge Information: Date: 01/20/2018 Activity: pelvic rest Diet: routine Medications: PNV and Ibuprofen Condition: stable Instructions: refer to practice specific booklet Discharge to: home Follow-up Information    Alexandra Reeds, Alexandra Blair Follow up in 4 week(s).   Specialty:  Obstetrics and Gynecology Contact information: [address] [place] [address] [phone]           Newborn Data: Live born female  Birth Weight: 7 lb 14 oz (3572 g) APGAR: 9, 9  Newborn Delivery   Birth date/time:  01/19/2018 10:10:00 Delivery type:  Vaginal, Spontaneous     Home with mother.  Alexandra Blair Alexandra Blair Alexandra Blair 01/20/2018, 8:21 AM

## 2020-04-14 NOTE — L&D Delivery Note (Signed)
Patient was C/C/+4 and pushed for <3 minutes with epidural.    NSVD  female infant, Apgars 8,9, weight P.   The patient had a midline perineal first degree laceration repaired with 2-0 vicryl R. Fundus was firm. EBL was expected amount. Placenta was delivered intact. Vagina was clear.  Delayed cord clamping done for 30-60 seconds while warming baby. Baby was vigorous and doing skin to skin with mother.  Alexandra Blair

## 2020-07-30 LAB — OB RESULTS CONSOLE ABO/RH: RH Type: POSITIVE

## 2020-07-30 LAB — OB RESULTS CONSOLE HEPATITIS B SURFACE ANTIGEN: Hepatitis B Surface Ag: NEGATIVE

## 2020-07-30 LAB — OB RESULTS CONSOLE RPR: RPR: NONREACTIVE

## 2020-07-30 LAB — OB RESULTS CONSOLE GC/CHLAMYDIA
Chlamydia: NEGATIVE
Gonorrhea: NEGATIVE

## 2020-07-30 LAB — OB RESULTS CONSOLE ANTIBODY SCREEN: Antibody Screen: NEGATIVE

## 2020-07-30 LAB — OB RESULTS CONSOLE HIV ANTIBODY (ROUTINE TESTING): HIV: NONREACTIVE

## 2020-07-30 LAB — OB RESULTS CONSOLE RUBELLA ANTIBODY, IGM: Rubella: IMMUNE

## 2020-07-30 LAB — HEPATITIS C ANTIBODY: HCV Ab: NEGATIVE

## 2020-09-05 ENCOUNTER — Other Ambulatory Visit: Payer: Self-pay | Admitting: Obstetrics and Gynecology

## 2020-09-05 DIAGNOSIS — Z363 Encounter for antenatal screening for malformations: Secondary | ICD-10-CM

## 2020-09-11 ENCOUNTER — Other Ambulatory Visit: Payer: Self-pay

## 2020-09-26 ENCOUNTER — Encounter: Payer: Self-pay | Admitting: *Deleted

## 2020-09-27 ENCOUNTER — Other Ambulatory Visit: Payer: BLUE CROSS/BLUE SHIELD

## 2020-09-27 ENCOUNTER — Ambulatory Visit: Payer: BLUE CROSS/BLUE SHIELD

## 2020-10-02 ENCOUNTER — Other Ambulatory Visit: Payer: Self-pay

## 2020-10-02 ENCOUNTER — Ambulatory Visit (HOSPITAL_BASED_OUTPATIENT_CLINIC_OR_DEPARTMENT_OTHER): Payer: BLUE CROSS/BLUE SHIELD

## 2020-10-02 ENCOUNTER — Ambulatory Visit: Payer: BLUE CROSS/BLUE SHIELD | Attending: Obstetrics and Gynecology | Admitting: *Deleted

## 2020-10-02 ENCOUNTER — Encounter: Payer: Self-pay | Admitting: *Deleted

## 2020-10-02 VITALS — BP 122/69 | HR 70

## 2020-10-02 DIAGNOSIS — Z363 Encounter for antenatal screening for malformations: Secondary | ICD-10-CM | POA: Diagnosis not present

## 2020-10-02 DIAGNOSIS — O09522 Supervision of elderly multigravida, second trimester: Secondary | ICD-10-CM | POA: Diagnosis not present

## 2020-10-02 DIAGNOSIS — Z3A19 19 weeks gestation of pregnancy: Secondary | ICD-10-CM | POA: Diagnosis not present

## 2020-10-02 DIAGNOSIS — Z3689 Encounter for other specified antenatal screening: Secondary | ICD-10-CM

## 2021-02-01 LAB — OB RESULTS CONSOLE GBS: GBS: NEGATIVE

## 2021-02-06 ENCOUNTER — Other Ambulatory Visit: Payer: Self-pay | Admitting: Advanced Practice Midwife

## 2021-02-06 ENCOUNTER — Other Ambulatory Visit: Payer: Self-pay | Admitting: Obstetrics and Gynecology

## 2021-02-08 ENCOUNTER — Encounter (HOSPITAL_COMMUNITY): Payer: Self-pay | Admitting: *Deleted

## 2021-02-08 ENCOUNTER — Encounter (HOSPITAL_COMMUNITY): Payer: Self-pay

## 2021-02-08 ENCOUNTER — Telehealth (HOSPITAL_COMMUNITY): Payer: Self-pay | Admitting: *Deleted

## 2021-02-08 NOTE — Telephone Encounter (Signed)
Preadmission screen  

## 2021-02-12 ENCOUNTER — Other Ambulatory Visit: Payer: Self-pay | Admitting: Advanced Practice Midwife

## 2021-02-13 ENCOUNTER — Encounter (HOSPITAL_COMMUNITY): Payer: Self-pay | Admitting: Obstetrics and Gynecology

## 2021-02-13 ENCOUNTER — Other Ambulatory Visit: Payer: Self-pay | Admitting: Obstetrics and Gynecology

## 2021-02-14 LAB — SARS CORONAVIRUS 2 (TAT 6-24 HRS): SARS Coronavirus 2: NEGATIVE

## 2021-02-15 ENCOUNTER — Inpatient Hospital Stay (HOSPITAL_COMMUNITY)
Admission: AD | Admit: 2021-02-15 | Discharge: 2021-02-17 | DRG: 805 | Disposition: A | Discharge status: Home / Self Care | Payer: BLUE CROSS/BLUE SHIELD | Attending: Obstetrics and Gynecology | Admitting: Obstetrics and Gynecology

## 2021-02-15 ENCOUNTER — Inpatient Hospital Stay (HOSPITAL_COMMUNITY): Payer: BLUE CROSS/BLUE SHIELD

## 2021-02-15 ENCOUNTER — Other Ambulatory Visit: Payer: Self-pay

## 2021-02-15 ENCOUNTER — Encounter (HOSPITAL_COMMUNITY): Payer: Self-pay | Admitting: Obstetrics and Gynecology

## 2021-02-15 ENCOUNTER — Inpatient Hospital Stay (HOSPITAL_COMMUNITY): Payer: BLUE CROSS/BLUE SHIELD | Admitting: Anesthesiology

## 2021-02-15 DIAGNOSIS — Z3A39 39 weeks gestation of pregnancy: Secondary | ICD-10-CM | POA: Diagnosis not present

## 2021-02-15 DIAGNOSIS — O411230 Chorioamnionitis, third trimester, not applicable or unspecified: Secondary | ICD-10-CM | POA: Diagnosis present

## 2021-02-15 DIAGNOSIS — O26893 Other specified pregnancy related conditions, third trimester: Secondary | ICD-10-CM | POA: Diagnosis present

## 2021-02-15 DIAGNOSIS — Z3490 Encounter for supervision of normal pregnancy, unspecified, unspecified trimester: Secondary | ICD-10-CM

## 2021-02-15 LAB — RAPID HIV SCREEN (HIV 1/2 AB+AG)
HIV 1/2 Antibodies: NONREACTIVE
HIV-1 P24 Antigen - HIV24: NONREACTIVE

## 2021-02-15 LAB — RPR: RPR Ser Ql: NONREACTIVE

## 2021-02-15 LAB — CBC
HCT: 35.2 % — ABNORMAL LOW (ref 36.0–46.0)
Hemoglobin: 12.2 g/dL (ref 12.0–15.0)
MCH: 30.3 pg (ref 26.0–34.0)
MCHC: 34.7 g/dL (ref 30.0–36.0)
MCV: 87.6 fL (ref 80.0–100.0)
Platelets: 266 10*3/uL (ref 150–400)
RBC: 4.02 MIL/uL (ref 3.87–5.11)
RDW: 13.7 % (ref 11.5–15.5)
WBC: 10 10*3/uL (ref 4.0–10.5)
nRBC: 0 % (ref 0.0–0.2)

## 2021-02-15 LAB — TYPE AND SCREEN
ABO/RH(D): B POS
Antibody Screen: NEGATIVE

## 2021-02-15 MED ORDER — DIPHENHYDRAMINE HCL 25 MG PO CAPS: 25.0000 mg | ORAL_CAPSULE | Freq: Four times a day (QID) | ORAL | Status: DC | PRN

## 2021-02-15 MED ORDER — LACTATED RINGERS IV SOLN: 500.0000 mL | Freq: Once | INTRAVENOUS | Status: DC

## 2021-02-15 MED ORDER — BENZOCAINE-MENTHOL 20-0.5 % EX AERO: 1.0000 "application " | INHALATION_SPRAY | CUTANEOUS | Status: DC | PRN

## 2021-02-15 MED ORDER — OXYTOCIN-SODIUM CHLORIDE 30-0.9 UT/500ML-% IV SOLN: 2.5000 [IU]/h | INTRAVENOUS | Status: DC

## 2021-02-15 MED ORDER — SODIUM CHLORIDE 0.9% FLUSH: 3.0000 mL | INTRAVENOUS | Status: DC | PRN

## 2021-02-15 MED ORDER — LACTATED RINGERS IV SOLN: INTRAVENOUS | Status: DC

## 2021-02-15 MED ORDER — TETANUS-DIPHTH-ACELL PERTUSSIS 5-2.5-18.5 LF-MCG/0.5 IM SUSY: 0.5000 mL | PREFILLED_SYRINGE | Freq: Once | INTRAMUSCULAR | Status: DC

## 2021-02-15 MED ORDER — ACETAMINOPHEN 325 MG PO TABS: 650.0000 mg | ORAL_TABLET | ORAL | Status: DC | PRN

## 2021-02-15 MED ORDER — LACTATED RINGERS IV SOLN: 500.0000 mL | INTRAVENOUS | Status: DC | PRN

## 2021-02-15 MED ORDER — FENTANYL-BUPIVACAINE-NACL 0.5-0.125-0.9 MG/250ML-% EP SOLN
12.0000 mL/h | EPIDURAL | Status: DC | PRN
  Administered 2021-02-15: 12 mL/h via EPIDURAL

## 2021-02-15 MED ORDER — OXYTOCIN BOLUS FROM INFUSION
333.0000 mL | Freq: Once | INTRAVENOUS | Status: AC
  Administered 2021-02-15: 333 mL via INTRAVENOUS

## 2021-02-15 MED ORDER — FERROUS SULFATE 325 (65 FE) MG PO TABS
325.0000 mg | ORAL_TABLET | Freq: Two times a day (BID) | ORAL | Status: DC
  Administered 2021-02-16 (×2): 325 mg via ORAL
  Filled 2021-02-15 (×2): qty 1

## 2021-02-15 MED ORDER — MEASLES, MUMPS & RUBELLA VAC IJ SOLR: 0.5000 mL | Freq: Once | INTRAMUSCULAR | Status: DC

## 2021-02-15 MED ORDER — EPHEDRINE 5 MG/ML INJ: 10.0000 mg | INTRAVENOUS | Status: DC | PRN

## 2021-02-15 MED ORDER — SODIUM CHLORIDE 0.9 % IV SOLN
2.0000 g | Freq: Four times a day (QID) | INTRAVENOUS | Status: DC
  Administered 2021-02-15 – 2021-02-16 (×2): 2 g via INTRAVENOUS
  Filled 2021-02-15 (×2): qty 2000

## 2021-02-15 MED ORDER — SOD CITRATE-CITRIC ACID 500-334 MG/5ML PO SOLN: 30.0000 mL | ORAL | Status: DC | PRN

## 2021-02-15 MED ORDER — ONDANSETRON HCL 4 MG/2ML IJ SOLN: 4.0000 mg | Freq: Four times a day (QID) | INTRAMUSCULAR | Status: DC | PRN

## 2021-02-15 MED ORDER — SODIUM CHLORIDE 0.9% FLUSH: 3.0000 mL | Freq: Two times a day (BID) | INTRAVENOUS | Status: DC

## 2021-02-15 MED ORDER — WITCH HAZEL-GLYCERIN EX PADS: 1.0000 "application " | MEDICATED_PAD | CUTANEOUS | Status: DC | PRN

## 2021-02-15 MED ORDER — COCONUT OIL OIL: 1.0000 "application " | TOPICAL_OIL | Status: DC | PRN

## 2021-02-15 MED ORDER — DIPHENHYDRAMINE HCL 50 MG/ML IJ SOLN: 12.5000 mg | INTRAMUSCULAR | Status: DC | PRN

## 2021-02-15 MED ORDER — METHYLERGONOVINE MALEATE 0.2 MG PO TABS: 0.2000 mg | ORAL_TABLET | ORAL | Status: DC | PRN

## 2021-02-15 MED ORDER — ONDANSETRON HCL 4 MG/2ML IJ SOLN: 4.0000 mg | INTRAMUSCULAR | Status: DC | PRN

## 2021-02-15 MED ORDER — PRENATAL MULTIVITAMIN CH
1.0000 | ORAL_TABLET | Freq: Every day | ORAL | Status: DC
  Administered 2021-02-16: 1 via ORAL
  Filled 2021-02-15: qty 1

## 2021-02-15 MED ORDER — TERBUTALINE SULFATE 1 MG/ML IJ SOLN: 0.2500 mg | Freq: Once | INTRAMUSCULAR | Status: DC | PRN

## 2021-02-15 MED ORDER — PHENYLEPHRINE 40 MCG/ML (10ML) SYRINGE FOR IV PUSH (FOR BLOOD PRESSURE SUPPORT)
PREFILLED_SYRINGE | INTRAVENOUS | Status: AC
  Filled 2021-02-15: qty 10

## 2021-02-15 MED ORDER — PHENYLEPHRINE 40 MCG/ML (10ML) SYRINGE FOR IV PUSH (FOR BLOOD PRESSURE SUPPORT): 80.0000 ug | PREFILLED_SYRINGE | INTRAVENOUS | Status: DC | PRN

## 2021-02-15 MED ORDER — ONDANSETRON HCL 4 MG PO TABS: 4.0000 mg | ORAL_TABLET | ORAL | Status: DC | PRN

## 2021-02-15 MED ORDER — MAGNESIUM HYDROXIDE 400 MG/5ML PO SUSP: 30.0000 mL | ORAL | Status: DC | PRN

## 2021-02-15 MED ORDER — GENTAMICIN SULFATE 40 MG/ML IJ SOLN
5.0000 mg/kg | INTRAVENOUS | Status: DC
  Administered 2021-02-15: 300 mg via INTRAVENOUS
  Filled 2021-02-15 (×2): qty 7.5

## 2021-02-15 MED ORDER — ZOLPIDEM TARTRATE 5 MG PO TABS: 5.0000 mg | ORAL_TABLET | Freq: Every evening | ORAL | Status: DC | PRN

## 2021-02-15 MED ORDER — SENNOSIDES-DOCUSATE SODIUM 8.6-50 MG PO TABS
2.0000 | ORAL_TABLET | Freq: Every day | ORAL | Status: DC
  Administered 2021-02-16: 2 via ORAL
  Filled 2021-02-15: qty 2

## 2021-02-15 MED ORDER — OXYCODONE-ACETAMINOPHEN 5-325 MG PO TABS: 2.0000 | ORAL_TABLET | ORAL | Status: DC | PRN

## 2021-02-15 MED ORDER — LIDOCAINE HCL (PF) 1 % IJ SOLN
INTRAMUSCULAR | Status: DC | PRN
  Administered 2021-02-15: 3 mL via EPIDURAL
  Administered 2021-02-15: 5 mL via EPIDURAL
  Administered 2021-02-15: 2 mL via EPIDURAL

## 2021-02-15 MED ORDER — OXYTOCIN-SODIUM CHLORIDE 30-0.9 UT/500ML-% IV SOLN
1.0000 m[IU]/min | INTRAVENOUS | Status: DC
  Administered 2021-02-15: 2 m[IU]/min via INTRAVENOUS
  Filled 2021-02-15: qty 500

## 2021-02-15 MED ORDER — SODIUM CHLORIDE 0.9 % IV SOLN: 250.0000 mL | INTRAVENOUS | Status: DC | PRN

## 2021-02-15 MED ORDER — DIBUCAINE (PERIANAL) 1 % EX OINT: 1.0000 "application " | TOPICAL_OINTMENT | CUTANEOUS | Status: DC | PRN

## 2021-02-15 MED ORDER — LIDOCAINE HCL (PF) 1 % IJ SOLN: 30.0000 mL | INTRAMUSCULAR | Status: DC | PRN

## 2021-02-15 MED ORDER — ACETAMINOPHEN 500 MG PO TABS
1000.0000 mg | ORAL_TABLET | Freq: Once | ORAL | Status: AC
Start: 2021-02-15 — End: 2021-02-15
  Administered 2021-02-15: 1000 mg via ORAL
  Filled 2021-02-15: qty 2

## 2021-02-15 MED ORDER — FENTANYL-BUPIVACAINE-NACL 0.5-0.125-0.9 MG/250ML-% EP SOLN
EPIDURAL | Status: AC
  Filled 2021-02-15: qty 250

## 2021-02-15 MED ORDER — SIMETHICONE 80 MG PO CHEW: 80.0000 mg | CHEWABLE_TABLET | ORAL | Status: DC | PRN

## 2021-02-15 MED ORDER — MISOPROSTOL 25 MCG QUARTER TABLET
25.0000 ug | ORAL_TABLET | ORAL | Status: DC | PRN
  Administered 2021-02-15 (×2): 25 ug via VAGINAL
  Filled 2021-02-15 (×2): qty 1

## 2021-02-15 MED ORDER — IBUPROFEN 800 MG PO TABS
800.0000 mg | ORAL_TABLET | Freq: Three times a day (TID) | ORAL | Status: DC
  Administered 2021-02-15 – 2021-02-17 (×5): 800 mg via ORAL
  Filled 2021-02-15 (×5): qty 1

## 2021-02-15 MED ORDER — METHYLERGONOVINE MALEATE 0.2 MG/ML IJ SOLN: 0.2000 mg | INTRAMUSCULAR | Status: DC | PRN

## 2021-02-15 NOTE — Anesthesia Procedure Notes (Signed)
Epidural Patient location during procedure: OB Start time: 02/15/2021 1:57 PM End time: 02/15/2021 2:02 PM  Staffing Anesthesiologist: Marcene Duos, MD Performed: anesthesiologist   Preanesthetic Checklist Completed: patient identified, IV checked, site marked, risks and benefits discussed, surgical consent, monitors and equipment checked, pre-op evaluation and timeout performed  Epidural Patient position: sitting Prep: DuraPrep and site prepped and draped Patient monitoring: continuous pulse ox and blood pressure Approach: midline Location: L4-L5 Injection technique: LOR air  Needle:  Needle type: Tuohy  Needle gauge: 17 G Needle length: 9 cm and 9 Needle insertion depth: 5 cm cm Catheter type: closed end flexible Catheter size: 19 Gauge Catheter at skin depth: 10 cm Test dose: negative  Assessment Events: blood not aspirated, injection not painful, no injection resistance, no paresthesia and negative IV test

## 2021-02-15 NOTE — Anesthesia Preprocedure Evaluation (Signed)
Anesthesia Evaluation  Patient identified by MRN, date of birth, ID band Patient awake    Reviewed: Allergy & Precautions, Patient's Chart, lab work & pertinent test results  Airway Mallampati: II       Dental   Pulmonary neg pulmonary ROS,    Pulmonary exam normal        Cardiovascular negative cardio ROS Normal cardiovascular exam     Neuro/Psych negative neurological ROS     GI/Hepatic negative GI ROS, Neg liver ROS,   Endo/Other  negative endocrine ROS  Renal/GU negative Renal ROS     Musculoskeletal   Abdominal   Peds  Hematology negative hematology ROS (+)   Anesthesia Other Findings   Reproductive/Obstetrics (+) Pregnancy                             Anesthesia Physical Anesthesia Plan  ASA: 2  Anesthesia Plan: Epidural   Post-op Pain Management:    Induction:   PONV Risk Score and Plan: 2 and Treatment may vary due to age or medical condition  Airway Management Planned: Natural Airway  Additional Equipment:   Intra-op Plan:   Post-operative Plan:   Informed Consent: I have reviewed the patients History and Physical, chart, labs and discussed the procedure including the risks, benefits and alternatives for the proposed anesthesia with the patient or authorized representative who has indicated his/her understanding and acceptance.       Plan Discussed with:   Anesthesia Plan Comments:         Anesthesia Quick Evaluation

## 2021-02-15 NOTE — Lactation Note (Signed)
This note was copied from a baby's chart. Lactation Consultation Note  Patient Name: Girl Tasheema Perrone [id number] Date: 02/15/2021 Reason for consult: Initial assessment;Mother's request;Term;Breastfeeding assistance Age:37 hours  LC assisted with latching infant at breast with signs of milk transfer. Mom had compression stripe, we worked on different positions to try to get more depth.   Plan 1. To feed based on cues 8-12x 24 hr period. Mom to offer breasts and look for signs of milk transfer.  2. If unable to latch, Mom to hand express and offer EBM via spoon.  3. I and O sheet reviewed.  All questions answered at the end of the visit.   Maternal Data    Feeding Mother's Current Feeding Choice: Breast Milk  LATCH Score Latch: Repeated attempts needed to sustain latch, nipple held in mouth throughout feeding, stimulation needed to elicit sucking reflex.  Audible Swallowing: Spontaneous and intermittent  Type of Nipple: Everted at rest and after stimulation  Comfort (Breast/Nipple): Soft / non-tender  Hold (Positioning): Assistance needed to correctly position infant at breast and maintain latch.  LATCH Score: 8   Lactation Tools Discussed/Used Tools:  (Mom asked to access her flange size for home pump, with manual pump 24 flanges best fit for her)  Interventions Interventions: Breast feeding basics reviewed;Support pillows;Education;Assisted with latch;Position options;Skin to skin;Expressed Clinical research associate;Infant Driven Feeding Algorithm education;Hand express;Breast compression;Adjust position  Discharge Pump: Personal  Consult Status Consult Status: Follow-up Date: 02/16/21 Follow-up type: In-patient    Willella Harding  Nicholson-Springer 02/15/2021, 7:51 PM

## 2021-02-15 NOTE — Progress Notes (Signed)
Pharmacy Antibiotic Note  Alexandra Blair is a 35 y.o. female admitted on 02/15/2021 for IOL at 39w due to [facility].  Pharmacy has been consulted for Gentamicin  dosing for postpartum maternal fever to r/o Triple I  Plan: Gentamicin 5mg /kg (300mg ) IV q24h Will continue to monitor  Height: 5\' 3"  (160 cm) Weight: 73.4 kg (161 lb 12.8 oz) IBW/kg (Calculated) : 52.4 ABW/Dosing weight: 60.8kg  Temp (24hrs), Avg:99.6 F (37.6 C), Min:97.8 F (36.6 C), Max:102.9 F (39.4 C)  Recent Labs  Lab 02/15/21 0023  WBC 10.0    CrCl cannot be calculated (No successful lab value found.).    Allergies  Allergen Reactions   Codeine Other (See Comments)    dizzy    Antimicrobials this admission: Ampicillin 2 gram IV q6h  11/4>>  Thank you for allowing pharmacy to be a part of this patient's care.  02/15/2021 7:06 PM

## 2021-02-15 NOTE — H&P (Signed)
35 y.o. [redacted]w[redacted]d  G3P2002 [facility] pt comes in for induction at term for [facility].  Otherwise has good fetal movement and no bleeding.  Past Medical History:  Diagnosis Date   H/O knee surgery    Vaginal Pap smear, abnormal     Past Surgical History:  Procedure Laterality Date   COLPOSCOPY     GYNECOLOGIC CRYOSURGERY     KNEE SURGERY Right    WISDOM TOOTH EXTRACTION N/A     OB History  Gravida Para Term Preterm AB Living  3 2 2     2   SAB IAB Ectopic Multiple Live Births        0 2    # Outcome Date GA Lbr Len/2nd Weight Sex Delivery Anes PTL Lv  3 Current           2 Term 01/19/18 [redacted]w[redacted]d 10:22 / 00:18 3572 g M Vag-Spont EPI  LIV  1 Term 02/03/14 [redacted]w[redacted]d 07:31 / 00:49 3498 g F Vag-Spont EPI  LIV    Social History   Socioeconomic History   Marital status: Married    Spouse name: Robbie   Number of children: 3   Years of education: Not on file   Highest education level: Not on file  Occupational History   Occupation: Dental hygeinist  Tobacco Use   Smoking status: Never   Smokeless tobacco: Never  Vaping Use   Vaping Use: Never used  Substance and Sexual Activity   Alcohol use: Not Currently    Comment: socially   Drug use: No   Sexual activity: Yes  Other Topics Concern   Not on file  Social History Narrative   Not on file   Social Determinants of Health   Financial Resource Strain: Not on file  Food Insecurity: Not on file  Transportation Needs: Not on file  Physical Activity: Not on file  Stress: Not on file  Social Connections: Not on file  Intimate Partner Violence: Not on file   Codeine    Prenatal Transfer Tool  Maternal Diabetes: No Genetic Screening: Normal Maternal Ultrasounds/Referrals: Normal Fetal Ultrasounds or other Referrals:  None Maternal Substance Abuse:  No Significant Maternal Medications:  None Significant Maternal Lab Results: Group B Strep negative  Other PNC: uncomplicated.    Vitals:   02/15/21 0217 02/15/21 0448 02/15/21  0521 02/15/21 0718  BP: (!) 117/59 107/72  127/76  Pulse: 72 74  67  Resp:      Temp:   98.4 F (36.9 C)   TempSrc:   Oral   Weight:      Height:        Lungs/Cor:  NAD Abdomen:  soft, gravid Ex:  no cords, erythema SVE:  now 1.5/th/high  5P Screening    Flowsheet Row Admission (Current) from 02/15/2021 in [facility] Labor and Delivery  Did any of your Parents have problems with alcohol or drug use? No  Do any of your friends (Peers) have problems with alcohol or drug use? No  Does your Partner have a problem with alcohol or drug use? No  Before you were pregnant did you have problems with alcohol or drug use? (Past) No  In the past month, did you drink beer, wine or liquor, or use other drugs? (Pregnancy) No  Follow up needed No      FHTs:  120s, good STV, NST R; Cat 1 tracing. Toco:  q 13/07/2020   A/P   Term AMA, for induction.  Cytotec, then pit and AROM.  GBS neg.  Loney Laurence

## 2021-02-16 LAB — CBC
HCT: 34.5 % — ABNORMAL LOW (ref 36.0–46.0)
Hemoglobin: 11.7 g/dL — ABNORMAL LOW (ref 12.0–15.0)
MCH: 29.9 pg (ref 26.0–34.0)
MCHC: 33.9 g/dL (ref 30.0–36.0)
MCV: 88.2 fL (ref 80.0–100.0)
Platelets: 228 10*3/uL (ref 150–400)
RBC: 3.91 MIL/uL (ref 3.87–5.11)
RDW: 14 % (ref 11.5–15.5)
WBC: 29.2 10*3/uL — ABNORMAL HIGH (ref 4.0–10.5)
nRBC: 0 % (ref 0.0–0.2)

## 2021-02-16 NOTE — Lactation Note (Signed)
This note was copied from a baby's chart. Parent request donor breast milk (formula if unavailable) to supplement breast feeding due to cluster feeding and baby not latching when fussy.  Parents have been informed of small tummy size of newborn, taught hand expression and understand the possible consequences of formula to the health of the infant and supplementation to maternal supply.   The possible consequences shared with patient include  1) Loss of confidence in breastfeeding  2) Engorgement  3) Allergic sensitization of baby(asthma/allergies) and  4) decreased milk supply for mother.   After discussion of the above the mother decided to suuplement with donor breast milk, permit signed.   The tool used to give formula supplement will be spoon before feeding to calm baby and facilitate latch if unable to hand express.

## 2021-02-16 NOTE — Lactation Note (Signed)
This note was copied from a baby's chart. Lactation Consultation Note  Patient Name: Girl Antha Niday [id number] Date: 02/16/2021 Reason for consult: Follow-up assessment;Term Age:37 hours   P3 mother whose infant is now 22 hours old.  This is a term baby at 39+0 weeks.  Baby was swaddled and asleep in the bassinet when I arrived.  Mother had some questions which I answered to her satisfaction.  Reviewed breast feeding basics and what to expect with milk "coming to volume."  Mother has been able to hand express colostrum and has 2 mls at bedside.  Encouraged her to feed back any EBM she obtains to baby after breast feeding.  She has given one formula supplementation of 2 mls.  Mother will continue to feed on cue.  Reviewed cluster feeding and encouraged mother to call her RN/LC for latch assistance as needed.  Mother has a DEBP for home use.  Father and family members present.   Maternal Data    Feeding Mother's Current Feeding Choice: Breast Milk  LATCH Score                    Lactation Tools Discussed/Used    Interventions    Discharge    Consult Status Consult Status: Follow-up Date: 02/17/21 Follow-up type: In-patient    Fordyce Lepak R Aron Needles 02/16/2021, 4:40 PM

## 2021-02-16 NOTE — Anesthesia Postprocedure Evaluation (Signed)
Anesthesia Post Note  Patient: Ashlyn Cabler Capes  Procedure(s) Performed: AN AD HOC LABOR EPIDURAL     Patient location during evaluation: Mother Baby Anesthesia Type: Epidural Level of consciousness: awake and alert Pain management: pain level controlled Vital Signs Assessment: post-procedure vital signs reviewed and stable Respiratory status: spontaneous breathing, nonlabored ventilation and respiratory function stable Cardiovascular status: stable Postop Assessment: no headache, no backache and epidural receding Anesthetic complications: no   No notable events documented.  Last Vitals:  Vitals:   02/16/21 0400 02/16/21 0803  BP: 122/76 121/78  Pulse: 62 62  Resp: 17 15  Temp: 36.9 C 36.9 C  SpO2: 98% 100%    Last Pain:  Vitals:   02/16/21 0803  TempSrc: Oral  PainSc:    Pain Goal: Patients Stated Pain Goal: 0 (02/15/21 0718)                 Rica Records

## 2021-02-16 NOTE — Progress Notes (Signed)
Patient is eating, ambulating, voiding.  Pain control is good.  Appropriate lochia, no complaints.  Pt denies fever or abd tenderness.  Vitals:   02/15/21 2200 02/16/21 0030 02/16/21 0400 02/16/21 0803  BP: 125/76 112/75 122/76 121/78  Pulse: 78 79 62 62  Resp: 17 17 17 15   Temp: 98.4 F (36.9 C) 99.1 F (37.3 C) 98.4 F (36.9 C) 98.4 F (36.9 C)  TempSrc: Oral Oral Oral Oral  SpO2: 98% 97% 98% 100%  Weight:      Height:        Fundus firm Abd: nontender Ext: no calf tenderness  Lab Results  Component Value Date   WBC 29.2 (H) 02/16/2021   HGB 11.7 (L) 02/16/2021   HCT 34.5 (L) 02/16/2021   MCV 88.2 02/16/2021   PLT 228 02/16/2021    --/--/B POS (11/04 0023)  A/P Post partum day 1 S/p IV amp gent for fever noted immediately after delivery, now afebrile and asymtomatic, last temp 11/4 at approx 19:00, will continue to monitor.  Advised pt would like her to stay until tomorrow for further monitoring, pt very much wanted to leave but agrees to stay. Otherwise doing well. Will monitor BPs as well, had prior severe BP yesterday approx 5pm  Routine care.  Expect d/c 11/6.    13/6

## 2021-02-17 LAB — CBC WITH DIFFERENTIAL/PLATELET
Abs Immature Granulocytes: 0.08 10*3/uL — ABNORMAL HIGH (ref 0.00–0.07)
Basophils Absolute: 0 10*3/uL (ref 0.0–0.1)
Basophils Relative: 0 %
Eosinophils Absolute: 0.2 10*3/uL (ref 0.0–0.5)
Eosinophils Relative: 2 %
HCT: 34 % — ABNORMAL LOW (ref 36.0–46.0)
Hemoglobin: 11.6 g/dL — ABNORMAL LOW (ref 12.0–15.0)
Immature Granulocytes: 1 %
Lymphocytes Relative: 15 %
Lymphs Abs: 1.7 10*3/uL (ref 0.7–4.0)
MCH: 30.1 pg (ref 26.0–34.0)
MCHC: 34.1 g/dL (ref 30.0–36.0)
MCV: 88.1 fL (ref 80.0–100.0)
Monocytes Absolute: 0.6 10*3/uL (ref 0.1–1.0)
Monocytes Relative: 5 %
Neutro Abs: 9.1 10*3/uL — ABNORMAL HIGH (ref 1.7–7.7)
Neutrophils Relative %: 77 %
Platelets: 232 10*3/uL (ref 150–400)
RBC: 3.86 MIL/uL — ABNORMAL LOW (ref 3.87–5.11)
RDW: 14.2 % (ref 11.5–15.5)
WBC: 11.7 10*3/uL — ABNORMAL HIGH (ref 4.0–10.5)
nRBC: 0 % (ref 0.0–0.2)

## 2021-02-17 NOTE — Discharge Summary (Signed)
Postpartum Discharge Summary     Patient Name: Alexandra Blair DOB: May 27, 1985 MRN: [id number]  Date of admission: 02/15/2021 Delivery date:02/15/2021  Delivering provider: Bobbye Charleston  Date of discharge: 02/17/2021  Admitting diagnosis: Pregnancy [Z34.90]AMA Intrauterine pregnancy: [redacted]w[redacted]d    Secondary diagnosis:  Active Problems:   Pregnancy  Additional problems: AMA    Discharge diagnosis: Term Pregnancy Delivered                                              Post partum procedures: none Augmentation: Pitocin and Cytotec Complications: Intrauterine Inflammation or infection (Chorioamniotis) vs Triple I shortly after delivery treated with ampicillin/gentamicin.  Remained afebrile >24hrs, WBCs reduced from 29 to 11.7  Hospital course: Induction of Labor With Vaginal Delivery   35y.o. yo G3P3003 at 37w0das admitted to the hospital 02/15/2021 for induction of labor.  Indication for induction: AMA.  Patient had an uncomplicated labor course as follows: Membrane Rupture Time/Date: 2:34 PM ,02/15/2021   Delivery Method:Vaginal, Spontaneous  Episiotomy: None  Lacerations:  1st degree;Perineal  Details of delivery can be found in separate delivery note.  Patient had a routine postpartum course. Patient is discharged home 02/17/21.  Newborn Data: Birth date:02/15/2021  Birth time:4:45 PM  Gender:Female  Living status:Living  Apgars:8 ,9  Weight:3540 g   Magnesium Sulfate received: No BMZ received: No Rhophylac:N/A MMR:N/A T-DaP: see office note Flu: N/A Transfusion:No  Physical exam  Vitals:   02/16/21 0400 02/16/21 0803 02/16/21 1404 02/16/21 2009  BP: 122/76 121/78 121/81 123/79  Pulse: 62 62 64 68  Resp: 17 15 16 16   Temp: 98.4 F (36.9 C) 98.4 F (36.9 C) 97.9 F (36.6 C) (!) 97.5 F (36.4 C)  TempSrc: Oral Oral Oral Axillary  SpO2: 98% 100% 100% 100%  Weight:      Height:       General: alert, cooperative, and no distress Lochia:  appropriate Uterine Fundus: firm Incision: N/A DVT Evaluation: No evidence of DVT seen on physical exam. Labs: Lab Results  Component Value Date   WBC 11.7 (H) 02/17/2021   HGB 11.6 (L) 02/17/2021   HCT 34.0 (L) 02/17/2021   MCV 88.1 02/17/2021   PLT 232 02/17/2021   No flowsheet data found. Edinburgh Score: Edinburgh Postnatal Depression Scale Screening Tool 02/17/2021  I have been able to laugh and see the funny side of things. 0  I have looked forward with enjoyment to things. 0  I have blamed myself unnecessarily when things went wrong. 1  I have been anxious or worried for no good reason. 1  I have felt scared or panicky for no good reason. 1  Things have been getting on top of me. 0  I have been so unhappy that I have had difficulty sleeping. 0  I have felt sad or miserable. 1  I have been so unhappy that I have been crying. 1  The thought of harming myself has occurred to me. 0  Edinburgh Postnatal Depression Scale Total 5      After visit meds:  Allergies as of 02/17/2021       Reactions   Codeine Other (See Comments)   dizzy        Medication List     TAKE these medications    ibuprofen 600 MG tablet Commonly known as: ADVIL Take 1 tablet (600  mg total) by mouth every 6 (six) hours.   prenatal multivitamin Tabs tablet Take 1 tablet by mouth daily at 12 noon.         Discharge home in stable condition Infant Feeding:  see peds notes Infant Disposition:home with mother Discharge instruction: per After Visit Summary and Postpartum booklet. Activity: Advance as tolerated. Pelvic rest for 6 weeks.  Diet: routine diet Anticipated Birth Control: Unsure Postpartum Appointment:4 weeks Additional Postpartum F/U: Postpartum Depression checkup Future Appointments:No future appointments. Follow up Visit:  Follow-up Information     Bobbye Charleston, MD Follow up in 4 week(s).   Specialty: Obstetrics and Gynecology Contact information: [address] [place] [address] [phone]                     02/17/2021 Alexandra Kenner, DO

## 2021-02-17 NOTE — Lactation Note (Signed)
This note was copied from a baby's chart. Lactation Consultation Note  Patient Name: Alexandra Blair [id number] Date: 02/17/2021 Reason for consult: Follow-up assessment Age:37 hours   P3 mother whose infant is now 68 hours old.  This is a term baby at 39+0 weeks.    Mother reported no concerns with breast feeding, stating baby latches well to the left breast.  She continues to work on obtaining a deeper latch on the right breast.  Reviewed basics with mother.  Baby is voiding/stooling well and mother feels comfortable with her progress since delivery.  She will continue to feed 8-12 times/24 hours or sooner if baby shows cues.  She has our OP phone number for any further concerns.  Family is awaiting discharge.  Mother has a DEBP for home use.  Father present.   Maternal Data    Feeding    LATCH Score                    Lactation Tools Discussed/Used    Interventions Interventions: Education  Discharge Discharge Education: Engorgement and breast care Pump: Personal  Consult Status Consult Status: Complete Date: 02/17/21 Follow-up type: Call as needed    Elsye Mccollister R Sherra Kimmons 02/17/2021, 10:18 AM

## 2021-02-22 ENCOUNTER — Inpatient Hospital Stay (HOSPITAL_COMMUNITY): Admit: 2021-02-22 | Payer: Self-pay

## 2021-03-01 ENCOUNTER — Telehealth (HOSPITAL_COMMUNITY): Payer: Self-pay | Admitting: *Deleted

## 2021-03-01 NOTE — Telephone Encounter (Signed)
Mom reports feeling well. No concerns about herself at this time. EPDS=3 (hospital score=5) Mom reports baby is well. Feeding, peeing, and pooping without difficulty. Sleeps in bassinet on back. Reviewed safe sleep. Mom has no concerns about baby at present.  Duffy Rhody, [place] 03-01-2021 at 9:55am

## 2022-10-12 IMAGING — US US MFM OB DETAIL+14 WK
1 series · 13 of 28 positions shown · non-contrast
Comparison: none

[Series 1: us mfm ob detail+14 wk · 13 of 150 slices shown]
[im 6/150]
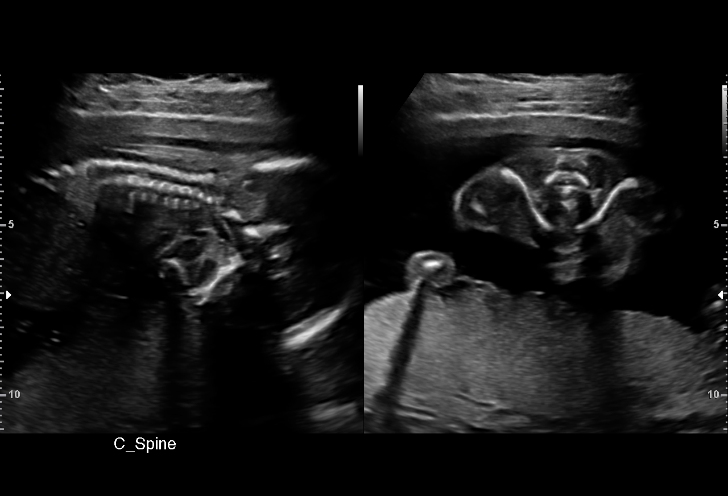
[im 17/150]
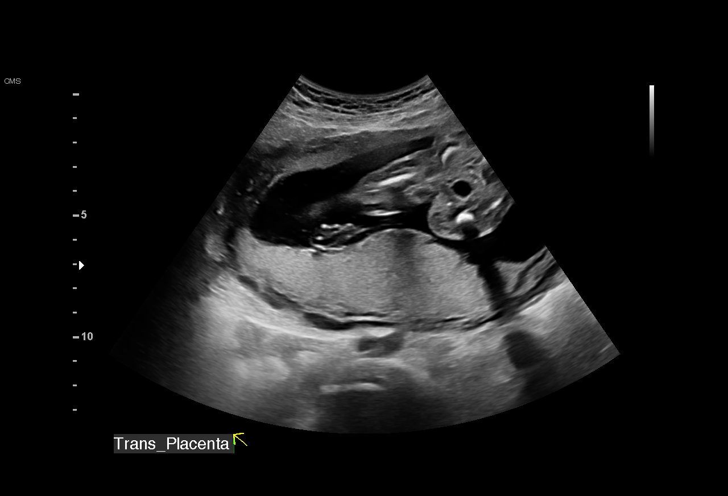
[im 28/150]
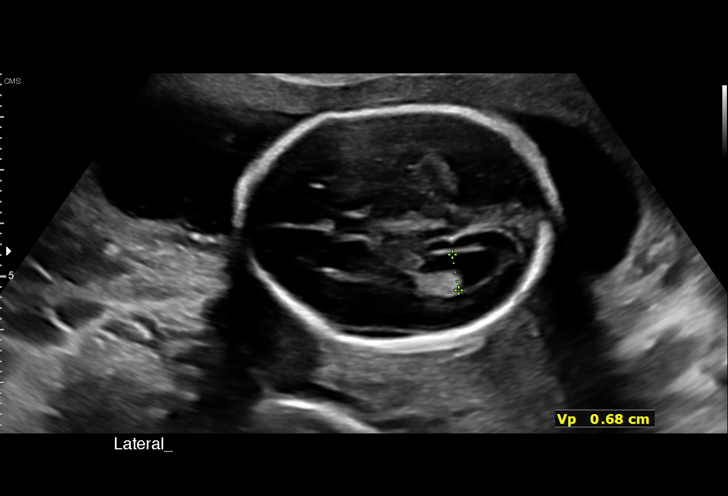
[im 39/150]
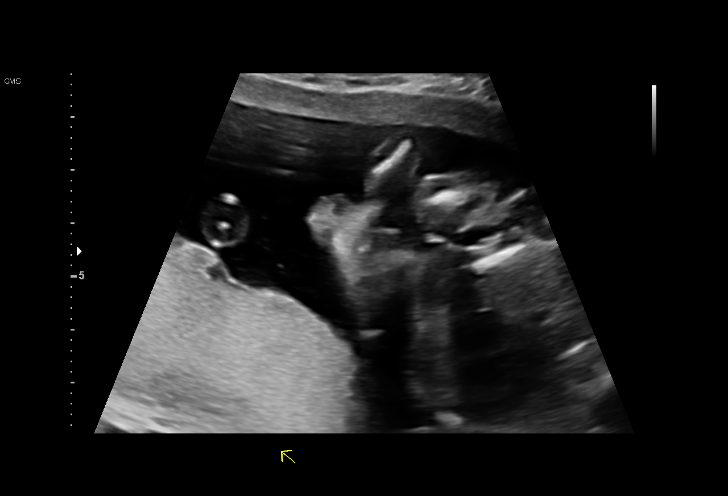
[im 50/150]
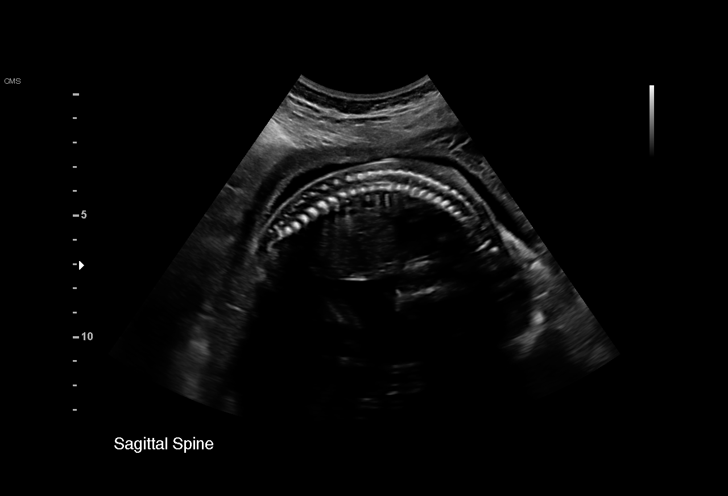
[im 61/150]
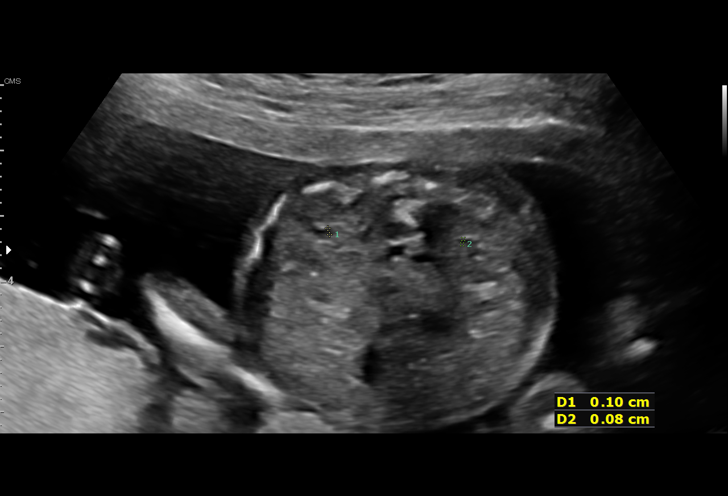
[im 78/150]
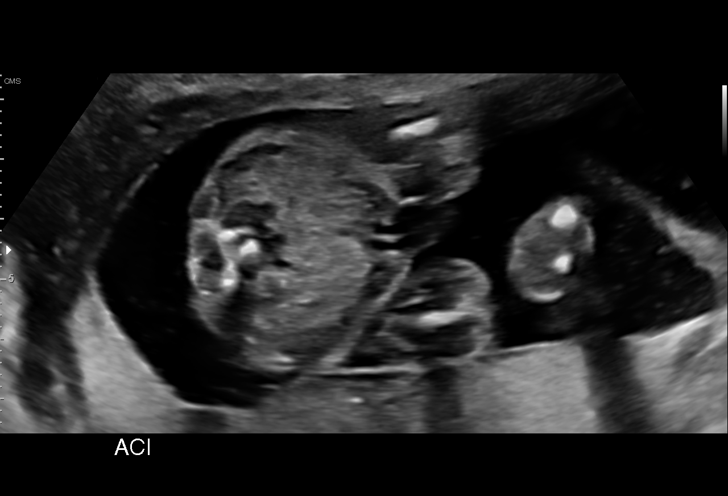
[im 89/150]
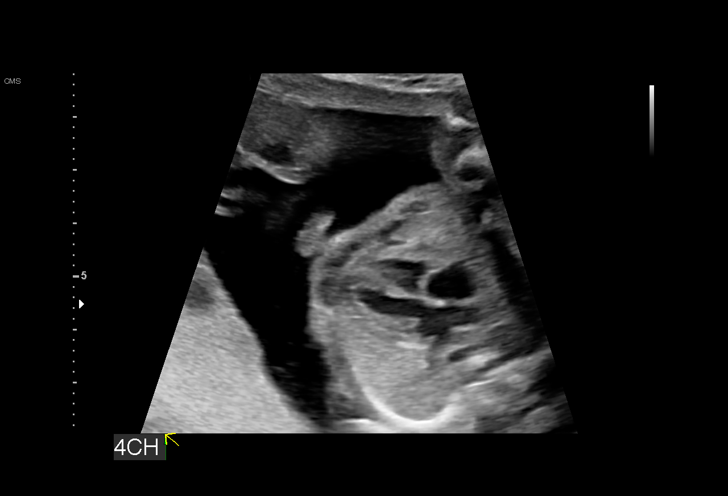
[im 100/150]
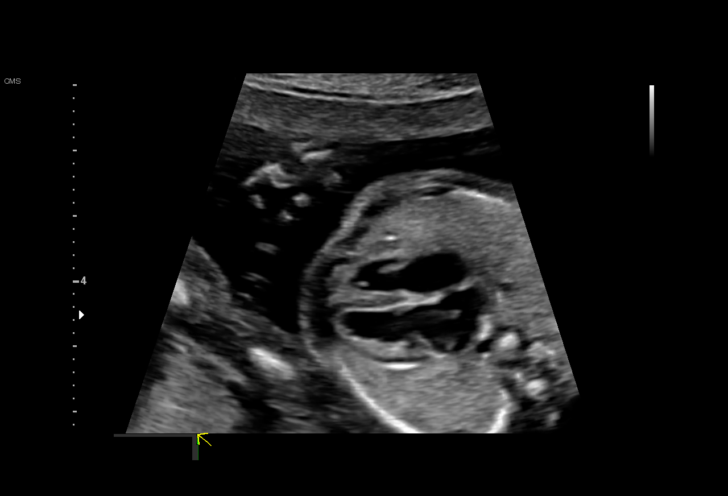
[im 111/150]
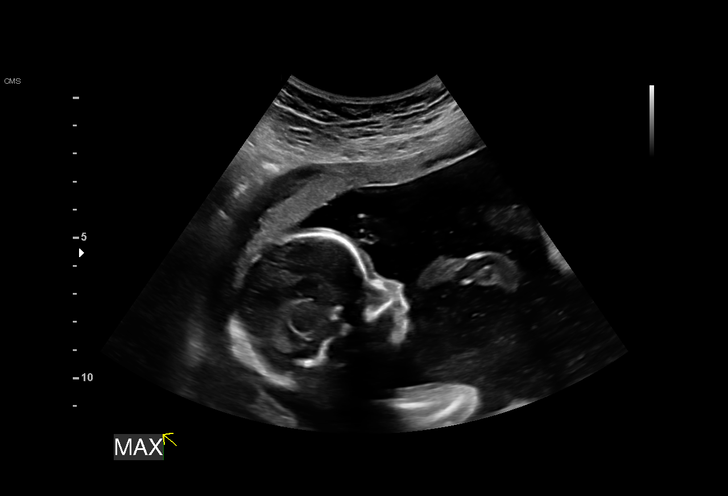
[im 122/150]
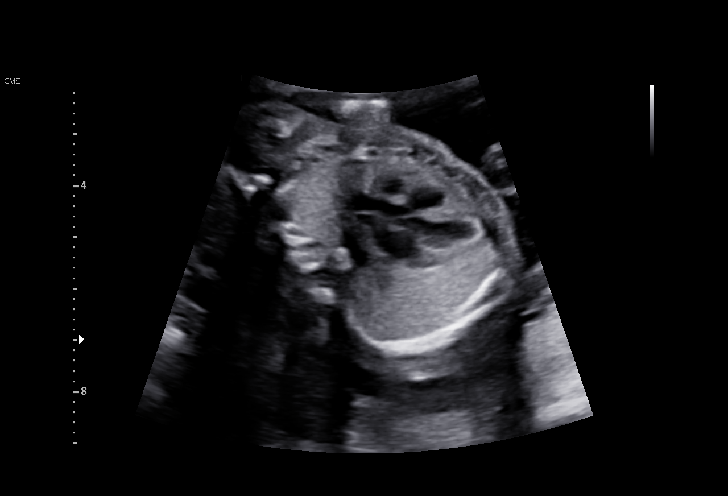
[im 133/150]
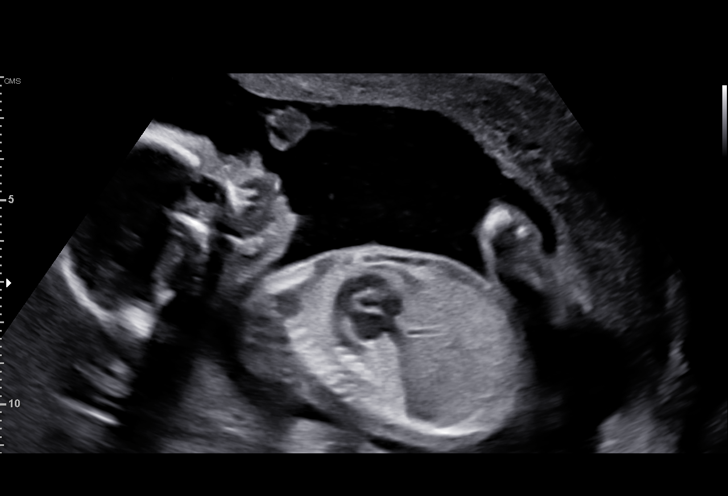
[im 144/150]
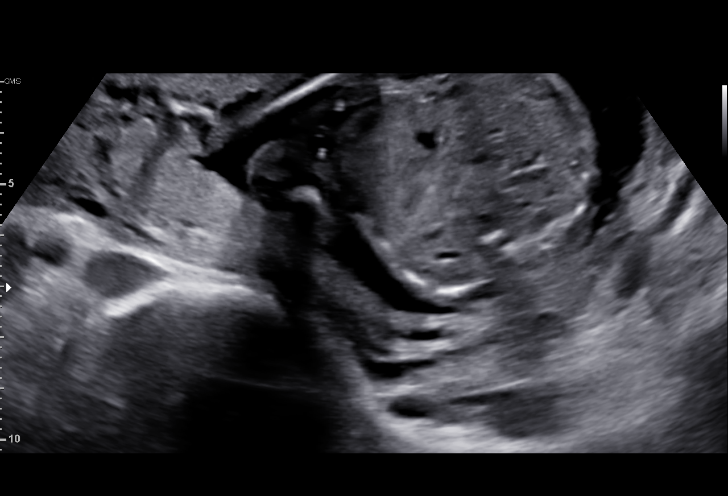

[13 of 28 positions shown; findings below may reference images not displayed]

[facility]

                                                            OB/Gyn and
                                                            Infertility
                   OB/Gyn

Indications

 Advanced maternal age multigravida 34,
 second trimester
 19 weeks gestation of pregnancy
 Antenatal screening for malformations
 Low risk NIPS, neg AFP
Fetal Evaluation

 Num Of Fetuses:         1
 Cardiac Activity:       Observed
 Presentation:           Variable
 Placenta:               Posterior
 P. Cord Insertion:      Visualized

 Amniotic Fluid
 AFI FV:      Within normal limits

                             Largest Pocket(cm)

Biometry
 BPD:      43.1  mm     G. Age:  19w 0d         28  %    CI:        65.29   %    70 - 86
                                                         FL/HC:      18.4   %    16.8 -
 HC:      171.3  mm     G. Age:  19w 5d         50  %    HC/AC:      1.12        1.09 -
 AC:      153.1  mm     G. Age:  20w 4d         75  %    FL/BPD:     73.3   %
 FL:       31.6  mm     G. Age:  19w 6d         52  %    FL/AC:      20.6   %    20 - 24
 HUM:      30.8  mm     G. Age:  20w 2d         69  %
 CER:      19.6  mm     G. Age:  19w 0d         39  %
 NFT:       4.2  mm

 LV:        6.8  mm
 CM:        6.3  mm

 Est. FW:     332  gm    0 lb 12 oz      76  %
OB History

 Gravidity:    3         Term:   2        Prem:   0        SAB:   0
 TOP:          0       Ectopic:  0        Living: 2
Gestational Age

 LMP:           19w 4d        Date:  05/18/20                 EDD:   02/22/21
 U/S Today:     19w 6d                                        EDD:   02/20/21
 Best:          19w 4d     Det. By:  LMP  (05/18/20)          EDD:   02/22/21
Anatomy

 Cranium:               Appears normal         Aortic Arch:            Appears normal
 Cavum:                 Appears normal         Ductal Arch:            Appears normal
 Ventricles:            Appears normal         Diaphragm:              Appears normal
 Choroid Plexus:        Appears normal         Stomach:                Appears normal, left
                                                                       sided
 Cerebellum:            Appears normal         Abdomen:                Appears normal
 Posterior Fossa:       Appears normal         Abdominal Wall:         Appears nml (cord
                                                                       insert, abd wall)
 Nuchal Fold:           Appears normal         Cord Vessels:           Appears normal (3
                                                                       vessel cord)
 Face:                  Appears normal         Kidneys:                Appear normal
                        (orbits and profile)
 Lips:                  Appears normal         Bladder:                Appears normal
 Thoracic:              Appears normal         Spine:                  Appears normal
 Heart:                 Appears normal         Upper Extremities:      Appears normal
                        (4CH, axis, and
                        situs)
 RVOT:                  Appears normal         Lower Extremities:      Appears normal
 LVOT:                  Appears normal
Targeted Anatomy

 Head/Neck
 Orbits/Eyes:           Appears normal         Mandible:               Appears normal

 Thorax
 SVC:                   Appears normal         3 V Trachea View:       Appears normal
 Interventr. Septum:    Appears normal         IVC:                    Appears normal
 3 Vessel View:         Appears normal
 Extremities
 Lt Hand:               Appears normal         Lt Foot:                Appears normal
 Rt Hand:               Appears normal         Rt Foot:                Appears normal

 Other
 Genitalia:             Normal Female
Cervix Uterus Adnexa

 Cervix
 Length:           3.61  cm.
 Normal appearance by transabdominal scan.

 Uterus
 No abnormality visualized.

 Right Ovary
 Within normal limits.

 Left Ovary
 Within normal limits.

 Cul De Sac
 No free fluid seen.

 Adnexa
 No abnormality visualized.
Comments

 This patient was seen for a detailed fetal anatomy scan due
 to advanced maternal age.
 She denies any significant past medical history and denies
 any problems in her current pregnancy.
 She had a cell free DNA test earlier in her pregnancy which
 indicated a low risk for trisomy 21, 18, and 13. A female fetus
 is predicted.
 She was informed that the fetal growth and amniotic fluid
 level were appropriate for her gestational age.
 There were no obvious fetal anomalies noted on today's
 ultrasound exam.
 The patient was informed that anomalies may be missed due
 to technical limitations. If the fetus is in a suboptimal position
 or maternal habitus is increased, visualization of the fetus in
 the maternal uterus may be impaired.
 The increased risk of fetal aneuploidy due to advanced
 maternal age was discussed. Due to advanced maternal age,
 the patient was offered and declined an amniocentesis today
 for definitive diagnosis of fetal aneuploidy.
 Follow-up as indicated.
